# Patient Record
Sex: Female | Born: 1955 | Race: White | Hispanic: No | Marital: Married | Smoking: Never smoker
Health system: Southern US, Community
[De-identification: ages and names within clinical notes are randomized; demographics above are authoritative.]

## PROBLEM LIST (undated history)

## (undated) DIAGNOSIS — F419 Anxiety disorder, unspecified: Secondary | ICD-10-CM

## (undated) DIAGNOSIS — Z8489 Family history of other specified conditions: Secondary | ICD-10-CM

## (undated) DIAGNOSIS — M1990 Unspecified osteoarthritis, unspecified site: Secondary | ICD-10-CM

## (undated) DIAGNOSIS — D649 Anemia, unspecified: Secondary | ICD-10-CM

## (undated) HISTORY — PX: BREAST EXCISIONAL BIOPSY: SUR124

## (undated) HISTORY — PX: JOINT REPLACEMENT: SHX530

## (undated) HISTORY — PX: EYE SURGERY: SHX253

## (undated) HISTORY — PX: BACK SURGERY: SHX140

---

## 1999-08-09 ENCOUNTER — Ambulatory Visit (HOSPITAL_BASED_OUTPATIENT_CLINIC_OR_DEPARTMENT_OTHER): Admission: RE | Admit: 1999-08-09 | Discharge: 1999-08-09 | Payer: Self-pay | Admitting: *Deleted

## 2003-06-22 ENCOUNTER — Encounter: Admission: RE | Admit: 2003-06-22 | Discharge: 2003-06-22 | Payer: Self-pay | Admitting: Family Medicine

## 2003-06-22 ENCOUNTER — Encounter: Payer: Self-pay | Admitting: Family Medicine

## 2004-09-02 ENCOUNTER — Encounter: Admission: RE | Admit: 2004-09-02 | Discharge: 2004-09-02 | Payer: Self-pay | Admitting: Family Medicine

## 2005-09-03 ENCOUNTER — Encounter: Admission: RE | Admit: 2005-09-03 | Discharge: 2005-09-03 | Payer: Self-pay | Admitting: Family Medicine

## 2006-09-07 ENCOUNTER — Encounter: Admission: RE | Admit: 2006-09-07 | Discharge: 2006-09-07 | Payer: Self-pay | Admitting: Family Medicine

## 2007-09-09 ENCOUNTER — Encounter: Admission: RE | Admit: 2007-09-09 | Discharge: 2007-09-09 | Payer: Self-pay | Admitting: Family Medicine

## 2008-09-11 ENCOUNTER — Encounter: Admission: RE | Admit: 2008-09-11 | Discharge: 2008-09-11 | Payer: Self-pay | Admitting: Family Medicine

## 2009-05-23 ENCOUNTER — Encounter: Admission: RE | Admit: 2009-05-23 | Discharge: 2009-05-23 | Payer: Self-pay | Admitting: Family Medicine

## 2009-06-25 ENCOUNTER — Emergency Department (HOSPITAL_COMMUNITY): Admission: EM | Admit: 2009-06-25 | Discharge: 2009-06-25 | Payer: Self-pay | Admitting: Emergency Medicine

## 2009-09-12 ENCOUNTER — Encounter: Admission: RE | Admit: 2009-09-12 | Discharge: 2009-09-12 | Payer: Self-pay | Admitting: Family Medicine

## 2009-10-03 ENCOUNTER — Encounter: Admission: RE | Admit: 2009-10-03 | Discharge: 2009-10-03 | Payer: Self-pay | Admitting: Family Medicine

## 2010-09-16 ENCOUNTER — Encounter: Admission: RE | Admit: 2010-09-16 | Discharge: 2010-09-16 | Payer: Self-pay | Admitting: Family Medicine

## 2010-12-31 DIAGNOSIS — N951 Menopausal and female climacteric states: Secondary | ICD-10-CM | POA: Insufficient documentation

## 2011-09-10 ENCOUNTER — Other Ambulatory Visit: Payer: Self-pay | Admitting: Family Medicine

## 2011-09-10 DIAGNOSIS — Z1231 Encounter for screening mammogram for malignant neoplasm of breast: Secondary | ICD-10-CM

## 2011-09-19 ENCOUNTER — Ambulatory Visit
Admission: RE | Admit: 2011-09-19 | Discharge: 2011-09-19 | Disposition: A | Discharge status: Home / Self Care | Payer: BC Managed Care – PPO | Source: Ambulatory Visit | Attending: Family Medicine | Admitting: Family Medicine

## 2011-09-19 DIAGNOSIS — Z1231 Encounter for screening mammogram for malignant neoplasm of breast: Secondary | ICD-10-CM

## 2012-08-30 ENCOUNTER — Other Ambulatory Visit: Payer: Self-pay | Admitting: Family Medicine

## 2012-08-30 DIAGNOSIS — Z1231 Encounter for screening mammogram for malignant neoplasm of breast: Secondary | ICD-10-CM

## 2012-10-07 ENCOUNTER — Ambulatory Visit
Admission: RE | Admit: 2012-10-07 | Discharge: 2012-10-07 | Disposition: A | Discharge status: Home / Self Care | Payer: BC Managed Care – PPO | Source: Ambulatory Visit | Attending: Family Medicine | Admitting: Family Medicine

## 2012-10-07 DIAGNOSIS — Z1231 Encounter for screening mammogram for malignant neoplasm of breast: Secondary | ICD-10-CM

## 2012-10-18 ENCOUNTER — Other Ambulatory Visit: Payer: Self-pay | Admitting: Family Medicine

## 2012-10-18 DIAGNOSIS — R928 Other abnormal and inconclusive findings on diagnostic imaging of breast: Secondary | ICD-10-CM

## 2012-10-22 ENCOUNTER — Ambulatory Visit
Admission: RE | Admit: 2012-10-22 | Discharge: 2012-10-22 | Disposition: A | Discharge status: Home / Self Care | Payer: BC Managed Care – PPO | Source: Ambulatory Visit | Attending: Family Medicine | Admitting: Family Medicine

## 2012-10-22 DIAGNOSIS — R928 Other abnormal and inconclusive findings on diagnostic imaging of breast: Secondary | ICD-10-CM

## 2013-01-07 DIAGNOSIS — M1710 Unilateral primary osteoarthritis, unspecified knee: Secondary | ICD-10-CM | POA: Insufficient documentation

## 2013-07-11 DIAGNOSIS — H35379 Puckering of macula, unspecified eye: Secondary | ICD-10-CM | POA: Insufficient documentation

## 2013-11-11 ENCOUNTER — Other Ambulatory Visit: Payer: Self-pay

## 2013-11-11 DIAGNOSIS — Z1231 Encounter for screening mammogram for malignant neoplasm of breast: Secondary | ICD-10-CM

## 2013-11-29 ENCOUNTER — Ambulatory Visit
Admission: RE | Admit: 2013-11-29 | Discharge: 2013-11-29 | Disposition: A | Discharge status: Home / Self Care | Payer: BC Managed Care – PPO | Source: Ambulatory Visit

## 2013-11-29 DIAGNOSIS — Z1231 Encounter for screening mammogram for malignant neoplasm of breast: Secondary | ICD-10-CM

## 2014-03-20 ENCOUNTER — Other Ambulatory Visit: Payer: Self-pay | Admitting: Orthopedic Surgery

## 2014-03-30 ENCOUNTER — Encounter (HOSPITAL_COMMUNITY): Payer: Self-pay | Admitting: Pharmacy Technician

## 2014-03-31 ENCOUNTER — Ambulatory Visit (HOSPITAL_COMMUNITY)
Admission: RE | Admit: 2014-03-31 | Discharge: 2014-03-31 | Disposition: A | Discharge status: Home / Self Care | Payer: BC Managed Care – PPO | Source: Ambulatory Visit | Attending: Orthopedic Surgery | Admitting: Orthopedic Surgery

## 2014-03-31 ENCOUNTER — Encounter (HOSPITAL_COMMUNITY)
Admission: RE | Admit: 2014-03-31 | Discharge: 2014-03-31 | Disposition: A | Discharge status: Home / Self Care | Payer: BC Managed Care – PPO | Source: Ambulatory Visit | Attending: Orthopedic Surgery | Admitting: Orthopedic Surgery

## 2014-03-31 ENCOUNTER — Other Ambulatory Visit: Payer: Self-pay

## 2014-03-31 ENCOUNTER — Encounter (HOSPITAL_COMMUNITY): Payer: Self-pay

## 2014-03-31 DIAGNOSIS — Z01818 Encounter for other preprocedural examination: Secondary | ICD-10-CM | POA: Insufficient documentation

## 2014-03-31 DIAGNOSIS — Z01812 Encounter for preprocedural laboratory examination: Secondary | ICD-10-CM | POA: Insufficient documentation

## 2014-03-31 HISTORY — DX: Anemia, unspecified: D64.9

## 2014-03-31 HISTORY — DX: Unspecified osteoarthritis, unspecified site: M19.90

## 2014-03-31 HISTORY — DX: Anxiety disorder, unspecified: F41.9

## 2014-03-31 LAB — BASIC METABOLIC PANEL
BUN: 19 mg/dL (ref 6–23)
CO2: 27 mEq/L (ref 19–32)
Calcium: 9.9 mg/dL (ref 8.4–10.5)
Chloride: 105 mEq/L (ref 96–112)
Creatinine, Ser: 0.67 mg/dL (ref 0.50–1.10)
GFR calc non Af Amer: >90 mL/min (ref >90)
Glucose, Bld: 68 mg/dL — ABNORMAL LOW (ref 70–99)
POTASSIUM: 4.8 meq/L (ref 3.7–5.3)
Sodium: 143 mEq/L (ref 137–147)

## 2014-03-31 LAB — CBC WITH DIFFERENTIAL/PLATELET
BASOS ABS: 0 10*3/uL (ref 0.0–0.1)
BASOS PCT: 0 % (ref 0–1)
Eosinophils Absolute: 0.1 10*3/uL (ref 0.0–0.7)
Eosinophils Relative: 1 % (ref 0–5)
HCT: 39.8 % (ref 36.0–46.0)
HEMOGLOBIN: 13.2 g/dL (ref 12.0–15.0)
Lymphocytes Relative: 38 % (ref 12–46)
Lymphs Abs: 3.5 10*3/uL (ref 0.7–4.0)
MCH: 29.2 pg (ref 26.0–34.0)
MCHC: 33.2 g/dL (ref 30.0–36.0)
MCV: 88.1 fL (ref 78.0–100.0)
MONOS PCT: 7 % (ref 3–12)
Monocytes Absolute: 0.6 10*3/uL (ref 0.1–1.0)
NEUTROS ABS: 4.9 10*3/uL (ref 1.7–7.7)
Neutrophils Relative %: 54 % (ref 43–77)
PLATELETS: 251 10*3/uL (ref 150–400)
RBC: 4.52 MIL/uL (ref 3.87–5.11)
RDW: 14.2 % (ref 11.5–15.5)
WBC: 9.1 10*3/uL (ref 4.0–10.5)

## 2014-03-31 LAB — URINALYSIS, ROUTINE W REFLEX MICROSCOPIC
Bilirubin Urine: NEGATIVE
GLUCOSE, UA: NEGATIVE mg/dL
HGB URINE DIPSTICK: NEGATIVE
KETONES UR: NEGATIVE mg/dL
Leukocytes, UA: NEGATIVE
Nitrite: NEGATIVE
PH: 5 (ref 5.0–8.0)
PROTEIN: NEGATIVE mg/dL
Specific Gravity, Urine: 1.02 (ref 1.005–1.030)
Urobilinogen, UA: 0.2 mg/dL (ref 0.0–1.0)

## 2014-03-31 LAB — SURGICAL PCR SCREEN
MRSA, PCR: NEGATIVE
STAPHYLOCOCCUS AUREUS: NEGATIVE

## 2014-03-31 LAB — ABO/RH: ABO/RH(D): A POS

## 2014-03-31 LAB — PROTIME-INR
INR: 0.93 (ref 0.00–1.49)
PROTHROMBIN TIME: 12.3 s (ref 11.6–15.2)

## 2014-03-31 LAB — TYPE AND SCREEN
ABO/RH(D): A POS
Antibody Screen: NEGATIVE

## 2014-03-31 LAB — APTT: aPTT: 31 seconds (ref 24–37)

## 2014-03-31 NOTE — Pre-Procedure Instructions (Signed)
[name]  03/31/2014   Your procedure is scheduled on: 04-10-2014   Monday   [facility] at 5:30 AM.   Call this number if you have problems the morning of surgery: [phone]   Remember:   Do not eat food or drink liquids after midnight.    Take these medicines the morning of surgery with A SIP OF WATER: none    Do not wear jewelry, make-up or nail polish.  Do not wear lotions, powders, or perfumes.   Do not shave 48 hours prior to surgery. Men may shave face and neck.  Do not bring valuables to the hospital.  [facility] is not responsible for any belongings or valuables.               Contacts, dentures or bridgework may not be worn into surgery.   Leave suitcase in the car. After surgery it may be brought to your room.   For patients admitted to the hospital, discharge time is determined by your treatment team.               Patients discharged the day of surgery will not be allowed to drive home.    Special Instructions: See attached sheet for instructions on CHG shower/bath   Please read over the following fact sheets that you were given: Pain Booklet, Coughing and Deep Breathing, Blood Transfusion Information and Surgical Site Infection Prevention

## 2014-04-08 DIAGNOSIS — M1711 Unilateral primary osteoarthritis, right knee: Secondary | ICD-10-CM | POA: Diagnosis present

## 2014-04-08 NOTE — H&P (Signed)
TOTAL KNEE ADMISSION H&P  Patient is being admitted for right total knee arthroplasty.  Subjective:  Chief Complaint:right knee pain.  HPI: Anne Parks, 58 y.o. female, has a history of pain and functional disability in the right knee due to arthritis and has failed non-surgical conservative treatments for greater than 12 weeks to includeNSAID's and/or analgesics, corticosteriod injections, use of assistive devices, weight reduction as appropriate and activity modification.  Onset of symptoms was gradual, starting >10 years ago with gradually worsening course since that time. The patient noted no past surgery on the right knee(s).  Patient currently rates pain in the right knee(s) at 10 out of 10 with activity. Patient has worsening of pain with activity and weight bearing, pain that interferes with activities of daily living and pain with passive range of motion.  Patient has evidence of joint subluxation and joint space narrowing by imaging studies.  There is no active infection.  Patient Active Problem List   Diagnosis Date Noted  . Arthritis of knee, right 04/08/2014   Past Medical History  Diagnosis Date  . Anxiety   . Arthritis   . Anemia     Past Surgical History  Procedure Laterality Date  . Back surgery    . Eye surgery      cataract  . Cesarean section  1990    No prescriptions prior to admission   No Known Allergies  History  Substance Use Topics  . Smoking status: Never Smoker   . Smokeless tobacco: Not on file  . Alcohol Use: No    No family history on file.   Review of Systems  Constitutional: Negative.   HENT: Negative.   Eyes: Negative.   Respiratory: Negative.   Cardiovascular: Negative.   Gastrointestinal: Negative.   Genitourinary: Negative.   Musculoskeletal: Positive for joint pain.  Skin: Negative.   Neurological: Negative.   Endo/Heme/Allergies: Negative.   Psychiatric/Behavioral: Negative.     Objective:  Physical Exam   Constitutional: She is oriented to person, place, and time. She appears well-developed and well-nourished.  HENT:  Head: Normocephalic and atraumatic.  Eyes: Pupils are equal, round, and reactive to light.  Neck: Normal range of motion. Neck supple.  Cardiovascular: Intact distal pulses.   Respiratory: Effort normal.  Musculoskeletal: She exhibits tenderness.  She has bilateral varus deformities of the knees 5 flexion contractures, but can flex 125 bilaterally she is tender along the medial joint line.  There is crepitus as you take her through range of motion on the medial side, right greater than left.  She is neurovascularly intact.    Neurological: She is alert and oriented to person, place, and time.  Skin: Skin is warm and dry.  Psychiatric: She has a normal mood and affect. Her behavior is normal. Judgment and thought content normal.    Vital signs in last 24 hours:    Labs:   There is no height or weight on file to calculate BMI.   Imaging Review X-rays showed medial compartment bone-on-bone arthritis early lateral subluxation of tibia beneath the femur on the right, minimal on the left.    Assessment/Plan:  End stage arthritis, right knee   The patient history, physical examination, clinical judgment of the provider and imaging studies are consistent with end stage degenerative joint disease of the right knee(s) and total knee arthroplasty is deemed medically necessary. The treatment options including medical management, injection therapy arthroscopy and arthroplasty were discussed at length. The risks and benefits of total knee  arthroplasty were presented and reviewed. The risks due to aseptic loosening, infection, stiffness, patella tracking problems, thromboembolic complications and other imponderables were discussed. The patient acknowledged the explanation, agreed to proceed with the plan and consent was signed. Patient is being admitted for inpatient treatment for  surgery, pain control, PT, OT, prophylactic antibiotics, VTE prophylaxis, progressive ambulation and ADL's and discharge planning. The patient is planning to be discharged home with home health services

## 2014-04-09 MED ORDER — TRANEXAMIC ACID 100 MG/ML IV SOLN
1000.0000 mg | INTRAVENOUS | Status: AC
  Administered 2014-04-10: 1000 mg via INTRAVENOUS
  Filled 2014-04-09: qty 10

## 2014-04-09 MED ORDER — CEFAZOLIN SODIUM-DEXTROSE 2-3 GM-% IV SOLR
2.0000 g | INTRAVENOUS | Status: AC
  Administered 2014-04-10: 2 g via INTRAVENOUS
  Filled 2014-04-09: qty 50

## 2014-04-10 ENCOUNTER — Encounter (HOSPITAL_COMMUNITY): Payer: Self-pay | Admitting: *Deleted

## 2014-04-10 ENCOUNTER — Encounter (HOSPITAL_COMMUNITY)
Admission: RE | Disposition: A | Discharge status: Home Health | Payer: Self-pay | Source: Ambulatory Visit | Attending: Orthopedic Surgery

## 2014-04-10 ENCOUNTER — Encounter (HOSPITAL_COMMUNITY): Payer: BC Managed Care – PPO | Admitting: Vascular Surgery

## 2014-04-10 ENCOUNTER — Inpatient Hospital Stay (HOSPITAL_COMMUNITY)
Admission: RE | Admit: 2014-04-10 | Discharge: 2014-04-12 | DRG: 470 | Disposition: A | Discharge status: Home Health | Payer: BC Managed Care – PPO | Source: Ambulatory Visit | Attending: Orthopedic Surgery | Admitting: Orthopedic Surgery

## 2014-04-10 ENCOUNTER — Inpatient Hospital Stay (HOSPITAL_COMMUNITY): Payer: BC Managed Care – PPO | Admitting: Certified Registered Nurse Anesthetist

## 2014-04-10 DIAGNOSIS — M1710 Unilateral primary osteoarthritis, unspecified knee: Principal | ICD-10-CM | POA: Diagnosis present

## 2014-04-10 DIAGNOSIS — F411 Generalized anxiety disorder: Secondary | ICD-10-CM | POA: Diagnosis present

## 2014-04-10 DIAGNOSIS — M1711 Unilateral primary osteoarthritis, right knee: Secondary | ICD-10-CM | POA: Diagnosis present

## 2014-04-10 HISTORY — PX: TOTAL KNEE ARTHROPLASTY: SHX125

## 2014-04-10 HISTORY — DX: Family history of other specified conditions: Z84.89

## 2014-04-10 SURGERY — ARTHROPLASTY, KNEE, TOTAL
Anesthesia: General | Site: Knee | Laterality: Right

## 2014-04-10 MED ORDER — ONDANSETRON HCL 4 MG/2ML IJ SOLN: 4.0000 mg | Freq: Once | INTRAMUSCULAR | Status: DC | PRN

## 2014-04-10 MED ORDER — KCL IN DEXTROSE-NACL 20-5-0.45 MEQ/L-%-% IV SOLN
INTRAVENOUS | Status: DC
  Administered 2014-04-10 – 2014-04-11 (×3): via INTRAVENOUS
  Filled 2014-04-10 (×8): qty 1000

## 2014-04-10 MED ORDER — MIDAZOLAM HCL 5 MG/5ML IJ SOLN
INTRAMUSCULAR | Status: DC | PRN
  Administered 2014-04-10: 1 mg via INTRAVENOUS

## 2014-04-10 MED ORDER — SODIUM CHLORIDE 0.9 % IJ SOLN
INTRAMUSCULAR | Status: DC | PRN
  Administered 2014-04-10: 40 mL via INTRAVENOUS

## 2014-04-10 MED ORDER — ONDANSETRON HCL 4 MG/2ML IJ SOLN
4.0000 mg | Freq: Four times a day (QID) | INTRAMUSCULAR | Status: DC | PRN
  Administered 2014-04-10: 4 mg via INTRAVENOUS
  Filled 2014-04-10: qty 2

## 2014-04-10 MED ORDER — ACETAMINOPHEN 325 MG PO TABS: 650.0000 mg | ORAL_TABLET | Freq: Four times a day (QID) | ORAL | Status: DC | PRN

## 2014-04-10 MED ORDER — BUPIVACAINE LIPOSOME 1.3 % IJ SUSP
INTRAMUSCULAR | Status: DC | PRN
  Administered 2014-04-10: 20 mL

## 2014-04-10 MED ORDER — ALUM & MAG HYDROXIDE-SIMETH 200-200-20 MG/5ML PO SUSP: 30.0000 mL | ORAL | Status: DC | PRN

## 2014-04-10 MED ORDER — PROPOFOL 10 MG/ML IV BOLUS
INTRAVENOUS | Status: DC | PRN
  Administered 2014-04-10: 170 mg via INTRAVENOUS

## 2014-04-10 MED ORDER — METOCLOPRAMIDE HCL 5 MG/ML IJ SOLN: 5.0000 mg | Freq: Three times a day (TID) | INTRAMUSCULAR | Status: DC | PRN

## 2014-04-10 MED ORDER — OXYCODONE-ACETAMINOPHEN 5-325 MG PO TABS: 1.0000 | ORAL_TABLET | ORAL | Status: DC | PRN

## 2014-04-10 MED ORDER — ONDANSETRON HCL 4 MG PO TABS: 4.0000 mg | ORAL_TABLET | Freq: Four times a day (QID) | ORAL | Status: DC | PRN

## 2014-04-10 MED ORDER — FENTANYL CITRATE 0.05 MG/ML IJ SOLN
INTRAMUSCULAR | Status: DC | PRN
  Administered 2014-04-10 (×3): 25 ug via INTRAVENOUS
  Administered 2014-04-10: 50 ug via INTRAVENOUS
  Administered 2014-04-10: 25 ug via INTRAVENOUS
  Administered 2014-04-10: 50 ug via INTRAVENOUS
  Administered 2014-04-10 (×2): 25 ug via INTRAVENOUS

## 2014-04-10 MED ORDER — HYDROMORPHONE HCL PF 1 MG/ML IJ SOLN
INTRAMUSCULAR | Status: AC
  Filled 2014-04-10: qty 1

## 2014-04-10 MED ORDER — METHOCARBAMOL 500 MG PO TABS
500.0000 mg | ORAL_TABLET | Freq: Four times a day (QID) | ORAL | Status: DC | PRN
  Administered 2014-04-11 – 2014-04-12 (×2): 500 mg via ORAL
  Filled 2014-04-10 (×2): qty 1

## 2014-04-10 MED ORDER — DIPHENHYDRAMINE HCL 12.5 MG/5ML PO ELIX: 12.5000 mg | ORAL_SOLUTION | ORAL | Status: DC | PRN

## 2014-04-10 MED ORDER — LIDOCAINE HCL (CARDIAC) 20 MG/ML IV SOLN
INTRAVENOUS | Status: AC
  Filled 2014-04-10: qty 5

## 2014-04-10 MED ORDER — PROPOFOL 10 MG/ML IV BOLUS
INTRAVENOUS | Status: AC
  Filled 2014-04-10: qty 20

## 2014-04-10 MED ORDER — PHENOL 1.4 % MT LIQD: 1.0000 | OROMUCOSAL | Status: DC | PRN

## 2014-04-10 MED ORDER — DOCUSATE SODIUM 100 MG PO CAPS
100.0000 mg | ORAL_CAPSULE | Freq: Two times a day (BID) | ORAL | Status: DC
  Administered 2014-04-10 – 2014-04-12 (×4): 100 mg via ORAL
  Filled 2014-04-10 (×5): qty 1

## 2014-04-10 MED ORDER — SENNOSIDES-DOCUSATE SODIUM 8.6-50 MG PO TABS: 1.0000 | ORAL_TABLET | Freq: Every evening | ORAL | Status: DC | PRN

## 2014-04-10 MED ORDER — KCL IN DEXTROSE-NACL 20-5-0.45 MEQ/L-%-% IV SOLN
INTRAVENOUS | Status: AC
  Filled 2014-04-10: qty 1000

## 2014-04-10 MED ORDER — LIDOCAINE HCL (CARDIAC) 20 MG/ML IV SOLN
INTRAVENOUS | Status: DC | PRN
  Administered 2014-04-10: 100 mg via INTRAVENOUS

## 2014-04-10 MED ORDER — OXYCODONE HCL 5 MG PO TABS
5.0000 mg | ORAL_TABLET | ORAL | Status: DC | PRN
  Administered 2014-04-10 – 2014-04-12 (×11): 10 mg via ORAL
  Filled 2014-04-10 (×12): qty 2

## 2014-04-10 MED ORDER — BUPIVACAINE LIPOSOME 1.3 % IJ SUSP
20.0000 mL | Freq: Once | INTRAMUSCULAR | Status: DC
  Filled 2014-04-10: qty 20

## 2014-04-10 MED ORDER — BUPIVACAINE-EPINEPHRINE (PF) 0.25% -1:200000 IJ SOLN
INTRAMUSCULAR | Status: AC
  Filled 2014-04-10: qty 30

## 2014-04-10 MED ORDER — METOCLOPRAMIDE HCL 10 MG PO TABS: 5.0000 mg | ORAL_TABLET | Freq: Three times a day (TID) | ORAL | Status: DC | PRN

## 2014-04-10 MED ORDER — ASPIRIN EC 325 MG PO TBEC
325.0000 mg | DELAYED_RELEASE_TABLET | Freq: Every day | ORAL | Status: DC
  Administered 2014-04-11 – 2014-04-12 (×2): 325 mg via ORAL
  Filled 2014-04-10 (×3): qty 1

## 2014-04-10 MED ORDER — MIDAZOLAM HCL 2 MG/2ML IJ SOLN
INTRAMUSCULAR | Status: AC
  Filled 2014-04-10: qty 2

## 2014-04-10 MED ORDER — CHLORHEXIDINE GLUCONATE 4 % EX LIQD: 60.0000 mL | Freq: Once | CUTANEOUS | Status: DC

## 2014-04-10 MED ORDER — FENTANYL CITRATE 0.05 MG/ML IJ SOLN
INTRAMUSCULAR | Status: AC
  Filled 2014-04-10: qty 5

## 2014-04-10 MED ORDER — BISACODYL 5 MG PO TBEC: 5.0000 mg | DELAYED_RELEASE_TABLET | Freq: Every day | ORAL | Status: DC | PRN

## 2014-04-10 MED ORDER — CEFUROXIME SODIUM 1.5 G IJ SOLR
INTRAMUSCULAR | Status: DC | PRN
  Administered 2014-04-10: 1.5 g

## 2014-04-10 MED ORDER — LORATADINE 10 MG PO TABS
10.0000 mg | ORAL_TABLET | Freq: Every day | ORAL | Status: DC
  Administered 2014-04-11 (×2): 10 mg via ORAL
  Filled 2014-04-10 (×4): qty 1

## 2014-04-10 MED ORDER — ASPIRIN EC 325 MG PO TBEC: 325.0000 mg | DELAYED_RELEASE_TABLET | Freq: Two times a day (BID) | ORAL | Status: DC

## 2014-04-10 MED ORDER — HYDROMORPHONE HCL PF 1 MG/ML IJ SOLN
1.0000 mg | INTRAMUSCULAR | Status: DC | PRN
  Administered 2014-04-10: 1 mg via INTRAVENOUS
  Filled 2014-04-10 (×2): qty 1

## 2014-04-10 MED ORDER — MENTHOL 3 MG MT LOZG
1.0000 | LOZENGE | OROMUCOSAL | Status: DC | PRN
Start: 2014-04-10 — End: 2014-04-12

## 2014-04-10 MED ORDER — METHOCARBAMOL 500 MG PO TABS: 500.0000 mg | ORAL_TABLET | Freq: Two times a day (BID) | ORAL | Status: DC

## 2014-04-10 MED ORDER — ONDANSETRON HCL 4 MG/2ML IJ SOLN
INTRAMUSCULAR | Status: DC | PRN
  Administered 2014-04-10: 4 mg via INTRAVENOUS

## 2014-04-10 MED ORDER — SODIUM CHLORIDE 0.9 % IR SOLN
Status: DC | PRN
  Administered 2014-04-10: 1000 mL

## 2014-04-10 MED ORDER — ONDANSETRON HCL 4 MG/2ML IJ SOLN
INTRAMUSCULAR | Status: AC
  Filled 2014-04-10: qty 2

## 2014-04-10 MED ORDER — ACETAMINOPHEN 650 MG RE SUPP: 650.0000 mg | Freq: Four times a day (QID) | RECTAL | Status: DC | PRN

## 2014-04-10 MED ORDER — CEFUROXIME SODIUM 1.5 G IJ SOLR
INTRAMUSCULAR | Status: AC
  Filled 2014-04-10: qty 1.5

## 2014-04-10 MED ORDER — HYDROMORPHONE HCL PF 1 MG/ML IJ SOLN
0.2500 mg | INTRAMUSCULAR | Status: DC | PRN
  Administered 2014-04-10: 0.5 mg via INTRAVENOUS

## 2014-04-10 MED ORDER — DEXTROSE-NACL 5-0.45 % IV SOLN: INTRAVENOUS | Status: DC

## 2014-04-10 MED ORDER — LACTATED RINGERS IV SOLN
INTRAVENOUS | Status: DC | PRN
  Administered 2014-04-10: 07:00:00 via INTRAVENOUS

## 2014-04-10 MED ORDER — MAGNESIUM CITRATE PO SOLN: 1.0000 | Freq: Once | ORAL | Status: AC | PRN

## 2014-04-10 MED ORDER — METHOCARBAMOL 1000 MG/10ML IJ SOLN
500.0000 mg | Freq: Four times a day (QID) | INTRAVENOUS | Status: DC | PRN
  Filled 2014-04-10: qty 5

## 2014-04-10 SURGICAL SUPPLY — 64 items
BANDAGE ESMARK 6X9 LF (GAUZE/BANDAGES/DRESSINGS) ×1 IMPLANT
BLADE SAG 18X100X1.27 (BLADE) ×3 IMPLANT
BLADE SAW SGTL 13X75X1.27 (BLADE) ×3 IMPLANT
BLADE SURG ROTATE 9660 (MISCELLANEOUS) IMPLANT
BNDG CMPR 9X6 STRL LF SNTH (GAUZE/BANDAGES/DRESSINGS) ×1
BNDG CMPR MED 10X6 ELC LF (GAUZE/BANDAGES/DRESSINGS) ×1
BNDG ELASTIC 6X10 VLCR STRL LF (GAUZE/BANDAGES/DRESSINGS) ×3 IMPLANT
BNDG ESMARK 6X9 LF (GAUZE/BANDAGES/DRESSINGS) ×3
BOWL SMART MIX CTS (DISPOSABLE) ×3 IMPLANT
CAP KNEE ATTUNE RP ×2 IMPLANT
CEMENT HV SMART SET (Cement) ×6 IMPLANT
COVER SURGICAL LIGHT HANDLE (MISCELLANEOUS) ×3 IMPLANT
CUFF TOURNIQUET SINGLE 34IN LL (TOURNIQUET CUFF) ×2 IMPLANT
CUFF TOURNIQUET SINGLE 44IN (TOURNIQUET CUFF) IMPLANT
DRAPE EXTREMITY T 121X128X90 (DRAPE) ×3 IMPLANT
DRAPE U-SHAPE 47X51 STRL (DRAPES) ×3 IMPLANT
DURAPREP 26ML APPLICATOR (WOUND CARE) ×4 IMPLANT
ELECT REM PT RETURN 9FT ADLT (ELECTROSURGICAL) ×3
ELECTRODE REM PT RTRN 9FT ADLT (ELECTROSURGICAL) ×1 IMPLANT
EVACUATOR 1/8 PVC DRAIN (DRAIN) ×3 IMPLANT
GAUZE XEROFORM 1X8 LF (GAUZE/BANDAGES/DRESSINGS) ×3 IMPLANT
GLOVE BIO SURGEON STRL SZ7.5 (GLOVE) ×3 IMPLANT
GLOVE BIO SURGEON STRL SZ8.5 (GLOVE) ×3 IMPLANT
GLOVE BIOGEL PI IND STRL 6.5 (GLOVE) IMPLANT
GLOVE BIOGEL PI IND STRL 8 (GLOVE) ×1 IMPLANT
GLOVE BIOGEL PI IND STRL 8.5 (GLOVE) IMPLANT
GLOVE BIOGEL PI IND STRL 9 (GLOVE) ×1 IMPLANT
GLOVE BIOGEL PI INDICATOR 6.5 (GLOVE) ×2
GLOVE BIOGEL PI INDICATOR 8 (GLOVE) ×2
GLOVE BIOGEL PI INDICATOR 8.5 (GLOVE) ×2
GLOVE BIOGEL PI INDICATOR 9 (GLOVE) ×2
GOWN STRL REUS W/ TWL LRG LVL3 (GOWN DISPOSABLE) ×1 IMPLANT
GOWN STRL REUS W/ TWL XL LVL3 (GOWN DISPOSABLE) ×2 IMPLANT
GOWN STRL REUS W/TWL LRG LVL3 (GOWN DISPOSABLE) ×3
GOWN STRL REUS W/TWL XL LVL3 (GOWN DISPOSABLE) ×6
HANDPIECE INTERPULSE COAX TIP (DISPOSABLE) ×3
HOOD PEEL AWAY FACE SHEILD DIS (HOOD) ×6 IMPLANT
KIT BASIN OR (CUSTOM PROCEDURE TRAY) ×3 IMPLANT
KIT ROOM TURNOVER OR (KITS) ×3 IMPLANT
MANIFOLD NEPTUNE II (INSTRUMENTS) ×3 IMPLANT
NDL SAFETY ECLIPSE 18X1.5 (NEEDLE) IMPLANT
NDL SPNL 18GX3.5 QUINCKE PK (NEEDLE) IMPLANT
NEEDLE 22X1 1/2 (OR ONLY) (NEEDLE) ×1 IMPLANT
NEEDLE HYPO 18GX1.5 SHARP (NEEDLE)
NEEDLE SPNL 18GX3.5 QUINCKE PK (NEEDLE) ×3 IMPLANT
NS IRRIG 1000ML POUR BTL (IV SOLUTION) ×3 IMPLANT
PACK TOTAL JOINT (CUSTOM PROCEDURE TRAY) ×3 IMPLANT
PAD ARMBOARD 7.5X6 YLW CONV (MISCELLANEOUS) ×4 IMPLANT
PADDING CAST COTTON 6X4 STRL (CAST SUPPLIES) ×1 IMPLANT
SET HNDPC FAN SPRY TIP SCT (DISPOSABLE) ×1 IMPLANT
SPONGE GAUZE 4X4 12PLY (GAUZE/BANDAGES/DRESSINGS) ×6 IMPLANT
STAPLER VISISTAT 35W (STAPLE) ×3 IMPLANT
SUCTION FRAZIER TIP 10 FR DISP (SUCTIONS) ×1 IMPLANT
SUT VIC AB 0 CTX 36 (SUTURE) ×3
SUT VIC AB 0 CTX36XBRD ANTBCTR (SUTURE) ×1 IMPLANT
SUT VIC AB 1 CTX 36 (SUTURE) ×3
SUT VIC AB 1 CTX36XBRD ANBCTR (SUTURE) ×1 IMPLANT
SUT VIC AB 2-0 CT1 27 (SUTURE) ×3
SUT VIC AB 2-0 CT1 TAPERPNT 27 (SUTURE) ×1 IMPLANT
SYR 30ML LL (SYRINGE) ×1 IMPLANT
SYR 50ML LL SCALE MARK (SYRINGE) ×3 IMPLANT
TOWEL OR 17X24 6PK STRL BLUE (TOWEL DISPOSABLE) ×3 IMPLANT
TOWEL OR 17X26 10 PK STRL BLUE (TOWEL DISPOSABLE) ×3 IMPLANT
WATER STERILE IRR 1000ML POUR (IV SOLUTION) ×4 IMPLANT

## 2014-04-10 NOTE — Transfer of Care (Signed)
Immediate Anesthesia Transfer of Care Note  Patient: Anne Parks  Procedure(s) Performed: Procedure(s): RIGHT TOTAL KNEE ARTHOPLASTY (Right)  Patient Location: PACU  Anesthesia Type:General  Level of Consciousness: awake, alert  and oriented  Airway & Oxygen Therapy: Patient Spontanous Breathing  Post-op Assessment: Report given to PACU RN  Post vital signs: Reviewed and stable  Complications: No apparent anesthesia complications

## 2014-04-10 NOTE — Op Note (Signed)
PATIENT ID:      Anne Parks  MRN:     [id number] DOB/AGE:    1955/12/02 / 58 y.o.       OPERATIVE REPORT    DATE OF PROCEDURE:  04/10/2014       PREOPERATIVE DIAGNOSIS:   RIGHT KNEE OSTEOARTHRITIS      Estimated body mass index is 28.35 kg/(m^2) as calculated from the following:   Height as of 03/31/14: 5\' 3"  (1.6 m).   Weight as of this encounter: 72.576 kg (160 lb).                                                        POSTOPERATIVE DIAGNOSIS:   RIGHT KNEE OSTEOARTHRITIS                                                                      PROCEDURE:  Procedure(s): RIGHT TOTAL KNEE ARTHOPLASTY Using DepuyAttune RP implants #5R Femur, #4Tibia, 10mm Attune RP bearing, 38 Patella     SURGEON: ROWAN,FRANK J    ASSISTANT:   Eric K. [employer] PA-C   (Present and scrubbed throughout the case, critical for assistance with exposure, retraction, instrumentation, and closure.)         ANESTHESIA: GET, ACB, Exparel  DRAINS: 2 medium hemovac in knee   TOURNIQUET TIME: 75min   COMPLICATIONS:  None     SPECIMENS: None   INDICATIONS FOR PROCEDURE: The patient has  RIGHT KNEE OSTEOARTHRITIS, varus deformities, XR shows bone on bone arthritis. Patient has failed all conservative measures including anti-inflammatory medicines, narcotics, attempts at  exercise and weight loss, cortisone injections and viscosupplementation.  Risks and benefits of surgery have been discussed, questions answered.   DESCRIPTION OF PROCEDURE: The patient identified by armband, received  IV antibiotics, [facility]. Patient taken to the operating room, appropriate anesthetic  monitors were attached, and general endotracheal anesthesia induced with  the patient in supine position, Foley catheter was inserted. Tourniquet  applied high to the operative thigh. Lateral post and foot positioner  applied to the table, the lower extremity was then prepped and draped  in usual sterile fashion from  the ankle to the tourniquet. Time-out procedure was performed. The limb was wrapped with an Esmarch bandage and the tourniquet inflated to 350 mmHg. We began the operation by making the anterior midline incision starting at handbreadth above the patella going over the patella 1 cm medial to and  4 cm distal to the tibial tubercle. Small bleeders in the skin and the  subcutaneous tissue identified and cauterized. Transverse retinaculum was incised and reflected medially and a medial parapatellar arthrotomy was accomplished. the patella was everted and theprepatellar fat pad resected. The superficial medial collateral  ligament was then elevated from anterior to posterior along the proximal  flare of the tibia and anterior half of the menisci resected. The knee was hyperflexed exposing bone on bone arthritis. Peripheral and notch osteophytes as well as the cruciate ligaments were then resected. We continued to  work our way around posteriorly along  the proximal tibia, and externally  rotated the tibia subluxing it out from underneath the femur. A McHale  retractor was placed through the notch and a lateral Hohmann retractor  placed, and we then drilled through the proximal tibia in line with the  axis of the tibia followed by an intramedullary guide rod and 2-degree  posterior slope cutting guide. The tibial cutting guide was pinned into place  allowing resection of 6 mm of bone medially and about 12 mm of bone  laterally because of her varus deformity. Satisfied with the tibial resection, we then  entered the distal femur 2 mm anterior to the PCL origin with the  intramedullary guide rod and applied the distal femoral cutting guide  set at 11mm, with 5 degrees of valgus. This was pinned along the  epicondylar axis. At this point, the distal femoral cut was accomplished without difficulty. We then sized for a #5R femoral component and pinned the guide in 3 degrees of external rotation.The chamfer  cutting guide was pinned into place. The anterior, posterior, and chamfer cuts were accomplished without difficulty followed by  the Attune RP box cutting guide and the box cut. We also removed posterior osteophytes from the posterior femoral condyles. At this  time, the knee was brought into full extension. We checked our  extension and flexion gaps and found them symmetric at 10mm.  The patella thickness measured at 23 mm. We set the cutting guide at 7.5 and removed the posterior 7.5 mm  of the patella, sized for a 38 button and drilled the lollipop. The knee  was then once again hyperflexed exposing the proximal tibia. We sized for a #4 tibial base plate, applied the smokestack and the conical reamer followed by the the Delta fin keel punch. We then hammered into place the Attune RP trial femoral component, inserted a 5-mm trial bearing, trial patellar button, and took the knee through range of motion from 0-130 degrees. No thumb pressure was required for patellar  tracking. At this point, all trial components were removed, a double batch of DePuy HV cement with 1500 mg of Zinacef was mixed and applied to all bony metallic mating surfaces except for the posterior condyles of the femur itself. In order, we  hammered into place the tibial tray and removed excess cement, the femoral component and removed excess cement, a 5mm Attune bearing  was inserted, and the knee brought to full extension with compression.  The patellar button was clamped into place, and excess cement  removed. While the cement cured the wound was irrigated out with normal saline solution pulse lavage, and medium Hemovac drains were placed from an anterolateral  approach. Ligament stability and patellar tracking were checked and found to be excellent. The parapatellar arthrotomy was closed with  running #1 Vicryl suture. The subcutaneous tissue with 0 and 2-0 undyed  Vicryl suture, and the skin with skin staples. A dressing of  Xeroform,  4 x 4, dressing sponges, Webril, and Ace wrap applied. The patient  awakened, extubated, and taken to recovery room without difficulty.   ROWAN,FRANK J 04/10/2014, 8:42 AM

## 2014-04-10 NOTE — Anesthesia Procedure Notes (Signed)
**Note De-Identified Anne Parks Obfuscation** Anesthesia Regional Block:  Femoral nerve block  Pre-Anesthetic Checklist: ,, timeout performed, Correct Patient, Correct Site, Correct Laterality, Correct Procedure, Correct Position, site marked, Risks and benefits discussed,  Surgical consent,  Pre-op evaluation,  At surgeon's request and post-op pain management  Laterality: Right  Prep: Maximum Sterile Barrier Precautions used, chloraprep and alcohol swabs       Needles:  Injection technique: Single-shot  Needle Type: Stimulator Needle - 80        Needle insertion depth: 5 cm   Additional Needles:  Procedures: nerve stimulator Femoral nerve block  Nerve Stimulator or Paresthesia:  Response: 0.5 mA, 0.1 ms, 5 cm  Additional Responses:   Narrative:  Start time: 04/10/2014 6:55 AM End time: 04/10/2014 7:05 AM Injection made incrementally with aspirations every 5 mL.  Performed by: Personally  Anesthesiologist: Maren BeachGregory E Smith MD  Additional Notes: Pt accepts procedure w/ risks. 12cc 0.5% Marcaine w/ epi w/o difficulty or discomfort. GES

## 2014-04-10 NOTE — Progress Notes (Signed)
Utilization review completed.  

## 2014-04-10 NOTE — Progress Notes (Signed)
Orthopedic Tech Progress Note Patient Details:  [name] 31-Jan-1956 [id number]  CPM Right Knee CPM Right Knee: On Right Knee Flexion (Degrees): 60 Right Knee Extension (Degrees): 0 Additional Comments: Trapeze bar and foot roll   Cammer, Mickie BailJennifer Parks 04/10/2014, 10:20 AM

## 2014-04-10 NOTE — Interval H&P Note (Signed)
History and Physical Interval Note:  04/10/2014 7:12 AM  Anne Parks  has presented today for surgery, with the diagnosis of RIGHT KNEE OSTEOARTHRITIS  The various methods of treatment have been discussed with the patient and family. After consideration of risks, benefits and other options for treatment, the patient has consented to  Procedure(s) with comments: TOTAL KNEE ARTHROPLASTY (Right) - EXPAREL, AVAILABLE BLOCK as a surgical intervention .  The patient's history has been reviewed, patient examined, no change in status, stable for surgery.  I have reviewed the patient's chart and labs.  Questions were answered to the patient's satisfaction.     Nestor LewandowskyOWAN,FRANK J

## 2014-04-10 NOTE — Evaluation (Signed)
Physical Therapy Evaluation Patient Details Name: Anne ArgueDonna C Kimmet MRN: [id number] DOB: [birth year] Today's Date: 04/10/2014   History of Present Illness  58 y.o. female s/p right TKA. Hx of anxiety and anemia.  Clinical Impression  Pt is s/p right TKA resulting in the deficits listed below (see PT Problem List). Pt ambulated up to 5 feet post op day #0, with only mild instability showing in right knee, no instances of buckling. Pt with nausea, no emesis - nurse notified. Pt will benefit from skilled PT to increase their independence and safety with mobility to allow discharge to the venue listed below. Anticipate she will progress nicely and be safe for d/c to home with the care of her son and husband.     Follow Up Recommendations Home health PT;Supervision for mobility/OOB    Equipment Recommendations  None recommended by PT    Recommendations for Other Services OT consult     Precautions / Restrictions Precautions Precautions: Knee Precaution Comments: Reviewed knee precautions and footsie roll use Restrictions Weight Bearing Restrictions: Yes RLE Weight Bearing: Weight bearing as tolerated      Mobility  Bed Mobility Overal bed mobility: Needs Assistance Bed Mobility: Supine to Sit     Supine to sit: Supervision;HOB elevated     General bed mobility comments: Supervision for safety with HOB elevated. VCs for technique. No physical assist needed  Transfers Overall transfer level: Needs assistance Equipment used: Rolling walker (2 wheeled) Transfers: Sit to/from Stand Sit to Stand: Min guard         General transfer comment: Min guard for safety from lowest bed setting. VCs for hand placement. Pt rocks several times for momentum before standing. Educated to put some weight through RLE upon standing for stability.  Ambulation/Gait Ambulation/Gait assistance: Min guard Ambulation Distance (Feet): 5 Feet Assistive device: Rolling walker (2 wheeled) Gait  Pattern/deviations: Step-to pattern;Decreased step length - left;Decreased stance time - right;Antalgic   Gait velocity interpretation: Below normal speed for age/gender General Gait Details: Educated on safe DME use with VCs for sequencing. Mild instability of Rt knee without knee immobilizer however no instances of buckling. Instructed on use of UEs for support and to activate Rt quad in stance phase for knee extension.  Stairs            Wheelchair Mobility    Modified Rankin (Stroke Patients Only)       Balance Overall balance assessment: Needs assistance Sitting-balance support: No upper extremity supported;Feet supported Sitting balance-Leahy Scale: Good     Standing balance support: Single extremity supported Standing balance-Leahy Scale: Poor                               Pertinent Vitals/Pain 4/10 pain Nauseated - no emesis Nurse notified Patient repositioned in chair for comfort.     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 [employer]     Hand Dominance   Dominant Hand: Left  Extremity/Trunk Assessment   Upper Extremity Assessment: Defer to OT evaluation           Lower Extremity Assessment: RLE deficits/detail RLE Deficits / Details: decreased strength and ROM - as expected post-op TKA       Communication   Communication: No difficulties  Cognition Arousal/Alertness:  Awake/alert Behavior During Therapy: WFL for tasks assessed/performed Overall Cognitive Status: Within Functional Limits for tasks assessed                      General Comments      Exercises Total Joint Exercises Ankle Circles/Pumps: AROM;Both;10 reps;Supine Quad Sets: AROM;Right;10 reps;Supine      Assessment/Plan    PT Assessment Patient needs continued PT services  PT Diagnosis Difficulty walking;Acute pain;Abnormality of gait   PT Problem List Decreased strength;Decreased range of motion;Decreased activity tolerance;Decreased balance;Decreased mobility;Decreased knowledge of use of DME;Decreased knowledge of precautions;Pain  PT Treatment Interventions DME instruction;Gait training;Stair training;Functional mobility training;Therapeutic activities;Therapeutic exercise;Balance training;Neuromuscular re-education;Patient/family education;Modalities   PT Goals (Current goals can be found in the Care Plan section) Acute Rehab PT Goals Patient Stated Goal: Go home and back to work PT Goal Formulation: With patient Time For Goal Achievement: 04/17/14 Potential to Achieve Goals: Good    Frequency 7X/week   Barriers to discharge        Co-evaluation               End of Session   Activity Tolerance: Patient tolerated treatment well Patient left: in chair;with call bell/phone within reach;with family/visitor present Nurse Communication: Mobility status;Other (comment) (Nausea)         Time: [id number] PT Time Calculation (min): 24 min   Charges:   PT Evaluation $Initial PT Evaluation Tier I: 1 Procedure PT Treatments $Therapeutic Activity: 8-22 mins   PT G Codes:         Charlsie MerlesLogan Secor Barbour, [place] CarolinaPT [phone]  Berton MountBarbour, Logan S 04/10/2014, 3:57 PM

## 2014-04-10 NOTE — Anesthesia Postprocedure Evaluation (Signed)
  Anesthesia Post-op Note  Patient: Anne Parks  Procedure(s) Performed: Procedure(s): RIGHT TOTAL KNEE ARTHOPLASTY (Right)  Patient Location: PACU  Anesthesia Type:GA combined with regional for post-op pain  Level of Consciousness: awake, alert , oriented and patient cooperative  Airway and Oxygen Therapy: Patient Spontanous Breathing  Post-op Pain: mild  Post-op Assessment: Post-op Vital signs reviewed, Patient's Cardiovascular Status Stable, Respiratory Function Stable, Patent Airway and No signs of Nausea or vomiting  Post-op Vital Signs: stable  Last Vitals:  Filed Vitals:   04/10/14 1045  BP: 146/70  Pulse: 74  Temp: 36.8 C  Resp:     Complications: No apparent anesthesia complications

## 2014-04-10 NOTE — Anesthesia Preprocedure Evaluation (Addendum)
Anesthesia Evaluation  Patient identified by MRN, date of birth, ID band Patient awake    Reviewed: Allergy & Precautions, H&P , NPO status , Patient's Chart, lab work & pertinent test results  Airway       Dental   Pulmonary          Cardiovascular     Neuro/Psych    GI/Hepatic   Endo/Other    Renal/GU      Musculoskeletal   Abdominal   Peds  Hematology   Anesthesia Other Findings   Reproductive/Obstetrics                           Anesthesia Physical Anesthesia Plan  ASA: I  Anesthesia Plan: General   Post-op Pain Management:    Induction: Intravenous  Airway Management Planned: LMA and Oral ETT  Additional Equipment:   Intra-op Plan:   Post-operative Plan: Extubation in OR  Informed Consent: I have reviewed the patients History and Physical, chart, labs and discussed the procedure including the risks, benefits and alternatives for the proposed anesthesia with the patient or authorized representative who has indicated his/her understanding and acceptance.     Plan Discussed with:   Anesthesia Plan Comments:         Anesthesia Quick Evaluation  

## 2014-04-10 NOTE — Plan of Care (Signed)
Problem: Consults Goal: Diagnosis- Total Joint Replacement Primary Total Knee Right     

## 2014-04-10 NOTE — Evaluation (Signed)
Occupational Therapy Evaluation Patient Details Name: Memory ArgueDonna C Godfrey MRN: [id number] DOB: August 12, 1956 Today's Date: 04/10/2014    History of Present Illness 58 y.o. female s/p right TKA. Hx of anxiety and anemia.   Clinical Impression   Pt s/p above. Pt independent with ADLs, PTA. Feel pt will benefit from acute OT to increase independence prior to d/c.     Follow Up Recommendations  No OT follow up;Supervision - Intermittent    Equipment Recommendations  Other (comment) (may need AE)    Recommendations for Other Services       Precautions / Restrictions Precautions Precautions: Knee Precaution Comments: Reviewed precautions Restrictions Weight Bearing Restrictions: Yes RLE Weight Bearing: Weight bearing as tolerated      Mobility Bed Mobility Overal bed mobility: Needs Assistance Bed Mobility: Sit to Supine      Sit to supine: Supervision   Transfers Overall transfer level: Needs assistance Equipment used: Rolling walker (2 wheeled) Transfers: Sit to/from Stand Sit to Stand: Min guard         General transfer comment: Cues for hand placement. Did help position walker closer to pt during toilet transfer.         ADL Overall ADL's : Needs assistance/impaired     Grooming: Wash/dry hands;Supervision/safety;Set up;Standing               Lower Body Dressing: Moderate assistance;Sit to/from stand   Toilet Transfer: Min guard;Ambulation;RW (3 in 1 over commode)           Functional mobility during ADLs: Min guard;Rolling walker General ADL Comments: Educated on safe shoewear and use of bag on walker. Educated on dressing technique and recommended sitting for LB ADLs. Pt ambulated to bathroom to urinate. Educated on benefit of reaching down to don/doff right sock as it increases ROM in knee. Pt unable to don/doff right sock.  Pt thinks she will sponge bathe.     Vision                     Perception     Praxis      Pertinent  Vitals/Pain Pain 4/10. Repositioned in footsie roll.     Hand Dominance Left   Extremity/Trunk Assessment Upper Extremity Assessment Upper Extremity Assessment: Overall WFL for tasks assessed   Lower Extremity Assessment Lower Extremity Assessment: Defer to PT evaluation RLE Deficits / Details: decreased strength and ROM - as expected post-op TKA RLE: Unable to fully assess due to pain       Communication Communication Communication: No difficulties   Cognition Arousal/Alertness: Awake/alert Behavior During Therapy: WFL for tasks assessed/performed Overall Cognitive Status: Within Functional Limits for tasks assessed                     General Comments          Shoulder Instructions      Home Living Family/patient expects to be discharged to:: Private residence Living Arrangements: Spouse/significant other;Children Available Help at Discharge: Family;Available 24 hours/day Type of Home: House Home Access: Stairs to enter Entergy CorporationEntrance Stairs-Number of Steps: 3 Entrance Stairs-Rails: Right Home Layout: Two level;Able to live on main level with bedroom/bathroom Alternate Level Stairs-Number of Steps: 15   Bathroom Shower/Tub: Tub/shower unit;Walk-in shower   Bathroom Toilet: Handicapped height     Home Equipment: Environmental consultantWalker - 2 wheels;Cane - single point;Bedside commode;Tub bench          Prior Functioning/Environment Level of Independence: Independent with assistive device(s)  Comments: used cane for ambulation - works in Engineering geologistlibrary at [employer]    OT Diagnosis: Acute pain   OT Problem List: Decreased range of motion;Decreased knowledge of use of DME or AE;Decreased knowledge of precautions;Pain   OT Treatment/Interventions: Self-care/ADL training;DME and/or AE instruction;Therapeutic activities;Patient/family education;Balance training    OT Goals(Current goals can be found in the care plan section) Acute Rehab OT Goals Patient Stated Goal: not  stated OT Goal Formulation: With patient Time For Goal Achievement: 04/17/14 Potential to Achieve Goals: Good ADL Goals Pt Will Perform Lower Body Bathing: with modified independence;sit to/from stand Pt Will Perform Lower Body Dressing: with modified independence;sit to/from stand Pt Will Transfer to Toilet: with modified independence;ambulating (3 in 1 over commode)  OT Frequency: Min 2X/week   Barriers to D/C:            Co-evaluation              End of Session Equipment Utilized During Treatment: Gait belt;Rolling walker CPM Right Knee CPM Right Knee: Off  Activity Tolerance: Patient tolerated treatment well Patient left: in bed;with call bell/phone within reach   Time: 1734-1755 OT Time Calculation (min): 21 min Charges:  OT General Charges $OT Visit: 1 Procedure OT Evaluation $Initial OT Evaluation Tier I: 1 Procedure OT Treatments $Self Care/Home Management : 8-22 mins G-CodesEarlie Raveling:    Straub, Lindsey L OTR/L [phone] 04/10/2014, 6:23 PM

## 2014-04-11 ENCOUNTER — Encounter (HOSPITAL_COMMUNITY): Payer: Self-pay | Admitting: General Practice

## 2014-04-11 LAB — CBC
HEMATOCRIT: 30.2 % — AB (ref 36.0–46.0)
Hemoglobin: 10 g/dL — ABNORMAL LOW (ref 12.0–15.0)
MCH: 29.1 pg (ref 26.0–34.0)
MCHC: 33.1 g/dL (ref 30.0–36.0)
MCV: 87.8 fL (ref 78.0–100.0)
Platelets: 209 10*3/uL (ref 150–400)
RBC: 3.44 MIL/uL — ABNORMAL LOW (ref 3.87–5.11)
RDW: 14.6 % (ref 11.5–15.5)
WBC: 13.9 10*3/uL — ABNORMAL HIGH (ref 4.0–10.5)

## 2014-04-11 NOTE — Progress Notes (Signed)
Patient ID: [name], female   DOB: Sep 02, 1956, 58 y.o.   MRN: [id number] PATIENT ID: [name]  MRN: [id number]  DOB/AGE:  Sep 02, 1956 / 58 y.o.  1 Day Post-Op Procedure(s) (LRB): RIGHT TOTAL KNEE ARTHOPLASTY (Right)    PROGRESS NOTE Subjective: Patient is alert, oriented, 1x Nausea, no Vomiting, yes passing gas, no Bowel Movement. Taking PO well. Denies SOB, Chest or Calf Pain. Using Incentive Spirometer, PAS in place. Ambulate 5 ft POD 0, CPM 0-60 Patient reports pain as 3 on 0-10 scale  .    Objective: Vital signs in last 24 hours: Filed Vitals:   04/10/14 1301 04/10/14 2013 04/11/14 0045 04/11/14 0535  BP: 111/55 125/72 117/60 120/64  Pulse: 73 88 86 85  Temp: 98.5 F (36.9 C) 97.9 F (36.6 C) 97.8 F (36.6 C) 99.3 F (37.4 C)  TempSrc:      Resp:      Weight:      SpO2: 98% 99% 96% 97%      Intake/Output from previous day: I/O last 3 completed shifts: In: 1771.3 [P.O.:240; I.V.:1531.3] Out: 1575 [Urine:1175; Drains:400]   Intake/Output this shift:     LABORATORY DATA:  Recent Labs  04/11/14 0455  WBC 13.9*  HGB 10.0*  HCT 30.2*  PLT 209    Examination: Neurologically intact ABD soft Neurovascular intact Sensation intact distally Intact pulses distally Dorsiflexion/Plantar flexion intact Incision: no drainage No cellulitis present Compartment soft} Blood and plasma separated in drain indicating minimal recent drainage, drain pulled without difficulty. Assessment:   1 Day Post-Op Procedure(s) (LRB): RIGHT TOTAL KNEE ARTHOPLASTY (Right) ADDITIONAL DIAGNOSIS:    Plan: PT/OT WBAT, CPM 5/hrs day until ROM 0-90 degrees, then D/C CPM DVT Prophylaxis:  SCDx72hrs, ASA 325 mg BID x 2 weeks DISCHARGE PLAN: Home, when passes PT DISCHARGE NEEDS: HHPT, HHRN, CPM, Walker and 3-in-1 comode seat     Anne Parks 04/11/2014, 7:09 AM

## 2014-04-11 NOTE — Progress Notes (Signed)
Physical Therapy Treatment Patient Details Name: Anne Parks MRN: [id number] DOB: January 09, 1956 Today's Date: 04/11/2014    History of Present Illness 58 y.o. female s/p right TKA. Hx of anxiety and anemia.    PT Comments    Patient is progressing well towards physical therapy goals, ambulating up to 80 feet with supervision for safety. Completed stair training this afternoon and performs this task safely and correctly however, pt states she does not feel fully confident with this task and may want to practice again prior to d/c. Will follow-up accordingly. Patient will continue to benefit from skilled physical therapy services to further improve independence with functional mobility. Anticipate she will be ready for d/c home from mobility standpoint tomorrow after one more therapy session.    Follow Up Recommendations  Home health PT;Supervision for mobility/OOB     Equipment Recommendations  None recommended by PT    Recommendations for Other Services OT consult     Precautions / Restrictions Precautions Precautions: Knee Precaution Comments: Reviewed precautions Restrictions Weight Bearing Restrictions: Yes RLE Weight Bearing: Weight bearing as tolerated    Mobility  Bed Mobility Overal bed mobility: Needs Assistance Bed Mobility: Supine to Sit     Supine to sit: Min assist Sit to supine: Supervision   General bed mobility comments: Min assist with HOB flat. Physical assist needed initially to assist RLE to edge of bed.  Transfers Overall transfer level: Needs assistance Equipment used: Rolling walker (2 wheeled) Transfers: Sit to/from Stand Sit to Stand: Supervision Stand pivot transfers: Min guard       General transfer comment: Performed from lowest bed setting, recliner, and BSC. Correctly places hands in safe position. Educated to use RLE to assist with standing by increased WB through this limb.  Ambulation/Gait Ambulation/Gait assistance:  Supervision Ambulation Distance (Feet): 80 Feet Assistive device: Rolling walker (2 wheeled) Gait Pattern/deviations: Step-to pattern;Step-through pattern;Decreased step length - left;Decreased stance time - right;Antalgic;Trunk flexed   Gait velocity interpretation: Below normal speed for age/gender General Gait Details: Continues to shows very antalgic gait pattern. Instructed to maintain upright posture and use UEs as needed but to attempt further WB through RLE. One instance of mild buckling, pt able to correct herself without physical assist from PT.    Stairs Stairs: Yes Stairs assistance: Min guard Stair Management: One rail Right;Step to pattern;Sideways Number of Stairs: 2 (x2) General stair comments: Stair training and education for correct sequencing and technique. Min guard for safety demonstrating good control of right knee with no physical assist required to block. Pt somewhat anxious and may want to practice again prior to d/c for confidence. Husband present and observed training for stair navigation.  Wheelchair Mobility    Modified Rankin (Stroke Patients Only)       Balance                                    Cognition Arousal/Alertness: Awake/alert Behavior During Therapy: WFL for tasks assessed/performed Overall Cognitive Status: Within Functional Limits for tasks assessed                      Exercises      General Comments        Pertinent Vitals/Pain Pain 3/10 at start of therapy - states it is "lower now" at end of therapy. Patient repositioned in chair for comfort.     Home Living  Prior Function            PT Goals (current goals can now be found in the care plan section) Acute Rehab PT Goals PT Goal Formulation: With patient Time For Goal Achievement: 04/17/14 Potential to Achieve Goals: Good Progress towards PT goals: Progressing toward goals    Frequency  7X/week    PT Plan  Current plan remains appropriate    Co-evaluation             End of Session   Activity Tolerance: Patient tolerated treatment well Patient left: in chair;with call bell/phone within reach;with family/visitor present     Time: 1331-1403 PT Time Calculation (min): 32 min  Charges:  $Gait Training: 8-22 mins $Therapeutic Activity: 8-22 mins                    G Codes:     BJ's WholesaleLogan Secor [place]bour, [place]PT [phone]  Berton MountBarbour, Logan S 04/11/2014, 2:33 PM

## 2014-04-11 NOTE — Progress Notes (Signed)
Physical Therapy Treatment Patient Details Name: Anne Parks MRN: [id number] DOB: 09/07/1956 Today's Date: 04/11/2014    History of Present Illness 58 y.o. female s/p right TKA. Hx of anxiety and anemia.    PT Comments    Patient is progressing well towards physical therapy goals, ambulating up to 65 feet with min guard for safety. Patient will continue to benefit from skilled physical therapy services to further improve independence with functional mobility. Plan for stair training this afternoon with physical therapy.   Follow Up Recommendations  Home health PT;Supervision for mobility/OOB     Equipment Recommendations  None recommended by PT    Recommendations for Other Services OT consult     Precautions / Restrictions Precautions Precautions: Knee Restrictions Weight Bearing Restrictions: Yes RLE Weight Bearing: Weight bearing as tolerated    Mobility  Bed Mobility Overal bed mobility: Needs Assistance Bed Mobility: Supine to Sit     Supine to sit: Supervision;HOB elevated     General bed mobility comments: Supervision for safety with HOB elevated. VCs for technique. No physical assist needed  Transfers Overall transfer level: Needs assistance Equipment used: Rolling walker (2 wheeled) Transfers: Sit to/from Stand Sit to Stand: Supervision         General transfer comment: Demonstrated improved sit<>stand today with both hands pushing from bed at lowest bed setting. VCs to lean forward. Requires extra time. Also performed from [facility]SC with no physical assist, but cues for hand placement.  Ambulation/Gait Ambulation/Gait assistance: Min guard Ambulation Distance (Feet): 65 Feet Assistive device: Rolling walker (2 wheeled) Gait Pattern/deviations: Step-to pattern;Step-through pattern;Decreased step length - left;Decreased stance time - right;Ataxic;Trunk flexed     General Gait Details: VCs for upright posture. Very antalgic type gait. Educated on Rt  glute activation in Rt stance phase and to extend Rt knee for quad activation. No instances of buckling.   Stairs            Wheelchair Mobility    Modified Rankin (Stroke Patients Only)       Balance                                    Cognition Arousal/Alertness: Awake/alert Behavior During Therapy: WFL for tasks assessed/performed Overall Cognitive Status: Within Functional Limits for tasks assessed                      Exercises Total Joint Exercises Goniometric ROM: 12-89 degrees Rt knee flexion in sitting.    General Comments        Pertinent Vitals/Pain 5/10 pain - pt states she is waiting until scheduled pain medication time to ask for pain medicine. Patient repositioned in chair for comfort.      Home Living Family/patient expects to be discharged to:: Private residence Living Arrangements: Spouse/significant other                  Prior Function            PT Goals (current goals can now be found in the care plan section) Acute Rehab PT Goals PT Goal Formulation: With patient Time For Goal Achievement: 04/17/14 Potential to Achieve Goals: Good Progress towards PT goals: Progressing toward goals    Frequency  7X/week    PT Plan Current plan remains appropriate    Co-evaluation             End of Session  Activity Tolerance: Patient tolerated treatment well Patient left: in chair;with call bell/phone within reach     Time: 0918-0945 PT Time Calculation (min): 27 min  Charges:  $Gait Training: 8-22 mins $Therapeutic Activity: 8-22 mins                    G Codes:      [facility]aleLog[facility] Secor [place], [place] [phone]  Berton MountBarbour, Logan S 04/11/2014, 10:19 AM

## 2014-04-11 NOTE — Clinical Social Work Note (Signed)
Referred to CSW today for ?SNF. Chart reviewed and have spoken with RNCM who indicates patient plans to d/c home with HH and DME. CSW to sign off- please contact us if SW needs arise. Janet Caldwell, MSW, LCSWA [phone]   

## 2014-04-11 NOTE — Progress Notes (Signed)
Occupational Therapy Treatment Patient Details Name: Anne Parks MRN: [id number] DOB: 24-Jan-1956 Today's Date: 04/11/2014    History of present illness 58 y.o. female s/p right TKA. Hx of anxiety and anemia.   OT comments  Pt. Able to demonstrate LB dressing while seated.  Progressing well with sit/stand and pivot transfers.  Also doing well with bed mobility.    Follow Up Recommendations  No OT follow up;Supervision - Intermittent    Equipment Recommendations    reports she will be borrowing a tub bench from a friend         Precautions / Restrictions Precautions Precautions: Knee Precaution Comments: Reviewed precautions Restrictions Weight Bearing Restrictions: Yes RLE Weight Bearing: Weight bearing as tolerated       Mobility Bed Mobility Overal bed mobility: Needs Assistance Bed Mobility: Sit to Supine     Supine to sit: Supervision;HOB elevated Sit to supine: Supervision   General bed mobility comments: hob flat, no rail.  instructional cues for technique. no physical assistance required  Transfers Overall transfer level: Needs assistance Equipment used: Rolling walker (2 wheeled) Transfers: Sit to/from UGI CorporationStand;Stand Pivot Transfers Sit to Stand: Supervision Stand pivot transfers: Min guard       General transfer comment: cues for backing all the way up to a surface prior to sitting down    Balance                                   ADL Overall ADL's : Needs assistance/impaired                     Lower Body Dressing: Min guard;Sitting/lateral leans   Toilet Transfer: Min guard;Ambulation;RW Toilet Transfer Details (indicate cue type and reason): simulated transfer using recliner to bed Toileting- Clothing Manipulation and Hygiene: Min guard;Sit to/from stand Toileting - Clothing Manipulation Details (indicate cue type and reason): simulated Tub/ Shower Transfer: Tub transfer;Tub Building surveyorbench Tub/Shower Transfer Details (indicate  cue type and reason):  provided demo for tub bench transfer, pt. is borrowing a tub bench from a friend for tub in downstairs b.room Functional mobility during ADLs: Min guard;Rolling walker General ADL Comments: pt. able to don/doff socks b les, but also reports having a reacher to use if needed and will have assistance at home also.  provided demo for tub transfer as pt. reports she will have a tub bench                                      Cognition   Behavior During Therapy: [facility] for tasks assessed/performed Overall Cognitive Status: Within Functional Limits for tasks assessed                                                      Pertinent Vitals/ Pain       States she thinks she is due for pain meds but wants to take with her lunch meal  Home Living Family/patient expects to be discharged to:: Private residence Living Arrangements: Spouse/significant other  Frequency Min 2X/week     Progress Toward Goals  OT Goals(current goals can now be found in the care plan section)  Progress towards OT goals: Progressing toward goals     Plan Discharge plan remains appropriate                     End of Session    Activity Tolerance Patient tolerated treatment well   Patient Left in bed;with call bell/phone within reach             Time: 1113-1140 OT Time Calculation (min): 27 min  Charges: OT General Charges $OT Visit: 1 Procedure OT Treatments $Self Care/Home Management : 23-37 mins  Robet LeuMorris, Takiera Mayo Lorraine, COTA/L 04/11/2014, 11:52 AM

## 2014-04-11 NOTE — Care Management Note (Signed)
[facility] MANAGEMENT NOTE 04/11/2014  Patient:  Anne Parks,Anne Parks   Account Number:  [id number]  Date Initiated:  04/11/2014  Documentation initiated by:  Vance PeperBRADY,SUSAN  Subjective/Objective Assessment:   58 yr old female s/p right total knee arthroplasty.     Action/Plan:   Case manager spoke with patient concerning home Parks and DME needs at discharge. Preoperatively setup with Anne Parks, no changes. Has family support at discharge.   Anticipated DC Date:  04/12/2014   Anticipated DC Plan:  HOME W HOME [facility] SERVICES      DC Planning Services  CM consult      PAC Choice  DURABLE MEDICAL EQUIPMENT  HOME Parks   Choice offered to / List presented to:  [name] Patient   DME arranged  3-N-1  WALKER - ROLLING  [facility]      DME Parks  [employer]     HH arranged  HH-2 PT      Anne Parks  Anne Parks   Status of service:  Completed, signed off Medicare Important Message given?   (If response is "NO", the following Medicare IM given date fields will be blank) Date Medicare IM given:   Date Additional Medicare IM given:    Discharge Disposition:  HOME W HOME Parks SERVICES  Per UR Regulation:  Reviewed for med. necessity/level of Parks/duration of stay  If discussed at Long Length of Stay Meetings, dates discussed:

## 2014-04-12 LAB — CBC
HCT: 27.8 % — ABNORMAL LOW (ref 36.0–46.0)
HEMOGLOBIN: 9.3 g/dL — AB (ref 12.0–15.0)
MCH: 29.2 pg (ref 26.0–34.0)
MCHC: 33.5 g/dL (ref 30.0–36.0)
MCV: 87.1 fL (ref 78.0–100.0)
PLATELETS: 182 10*3/uL (ref 150–400)
RBC: 3.19 MIL/uL — ABNORMAL LOW (ref 3.87–5.11)
RDW: 14.6 % (ref 11.5–15.5)
WBC: 16.4 10*3/uL — ABNORMAL HIGH (ref 4.0–10.5)

## 2014-04-12 NOTE — Progress Notes (Signed)
PATIENT ID: CADI RHINEHART  MRN: [id number]  DOB/AGE:  Oct 20, 1956 / 58 y.o.  2 Days Post-Op Procedure(s) (LRB): RIGHT TOTAL KNEE ARTHOPLASTY (Right)    PROGRESS NOTE Subjective: Patient is alert, oriented, no Nausea, no Vomiting, yes passing gas, no Bowel Movement. Taking PO well, pt up eating in bed. Denies SOB, Chest or Calf Pain. Using Incentive Spirometer, PAS in place. Ambulate WBAT, CPM 0-62 Patient reports pain as 4 on 0-10 scale  .    Objective: Vital signs in last 24 hours: Filed Vitals:   04/11/14 0535 04/11/14 1532 04/11/14 2148 04/12/14 0509  BP: 120/64 112/67 105/65 114/74  Pulse: 85 102 115 105  Temp: 99.3 F (37.4 C) 99.8 F (37.7 C) 99.5 F (37.5 C) 99.2 F (37.3 C)  TempSrc:  Oral Oral Oral  Resp:  18 18 18   Weight:      SpO2: 97% 94% 98% 94%      Intake/Output from previous day: I/O last 3 completed shifts: In: 2951.3 [P.O.:720; I.V.:2231.3] Out: 950 [Urine:750; Drains:200]   Intake/Output this shift:     LABORATORY DATA:  Recent Labs  04/11/14 0455 04/12/14 0523  WBC 13.9* 16.4*  HGB 10.0* 9.3*  HCT 30.2* 27.8*  PLT 209 182    Examination: Neurologically intact Neurovascular intact Sensation intact distally Intact pulses distally Dorsiflexion/Plantar flexion intact Incision: dressing C/D/I No cellulitis present Compartment soft}  Assessment:   2 Days Post-Op Procedure(s) (LRB): RIGHT TOTAL KNEE ARTHOPLASTY (Right) ADDITIONAL DIAGNOSIS:    Plan: PT/OT WBAT, CPM 5/hrs day until ROM 0-90 degrees, then D/C CPM DVT Prophylaxis:  SCDx72hrs, ASA 325 mg BID x 2 weeks DISCHARGE PLAN: Home, today once pt has met PT goals DISCHARGE NEEDS: HHPT, HHRN, CPM, Walker and 3-in-1 comode seat     PHILLIPS, ERIC R 04/12/2014, 7:47 AM

## 2014-04-12 NOTE — Progress Notes (Signed)
Patient [facility] home with husband. [facility] given. All questions were answered.

## 2014-04-12 NOTE — Discharge Summary (Signed)
Patient ID: Anne Parks MRN: [id number] DOB/AGE: [birth year] 60 y.o.  Admit date: 04/10/2014 Discharge date: 04/12/2014  Admission Diagnoses:  Principal Problem:   Arthritis of knee, right   Discharge Diagnoses:  Same  Past Medical History  Diagnosis Date  . Anxiety   . Arthritis   . Anemia   . Family history of anesthesia complication     sister has nausea    Surgeries: Procedure(s): RIGHT TOTAL KNEE ARTHOPLASTY on 04/10/2014   Consultants:    Discharged Condition: Improved  Hospital Course: Anne Parks is an 58 y.o. female who was admitted 04/10/2014 for operative treatment ofArthritis of knee, right. Patient has severe unremitting pain that affects sleep, daily activities, and work/hobbies. After pre-op clearance the patient was taken to the operating room on 04/10/2014 and underwent  Procedure(s): RIGHT TOTAL KNEE ARTHOPLASTY.    Patient was given perioperative antibiotics: Anti-infectives   Start     Dose/Rate Route Frequency Ordered Stop   04/10/14 0800  cefUROXime (ZINACEF) injection  Status:  Discontinued       As needed 04/10/14 0801 04/10/14 0915   04/10/14 0600  ceFAZolin (ANCEF) IVPB 2 g/50 mL premix     2 g 100 mL/hr over 30 Minutes Intravenous On call to O.R. 04/09/14 1314 04/10/14 0728       Patient was given sequential compression devices, early ambulation, and chemoprophylaxis to prevent DVT.  Patient benefited maximally from hospital stay and there were no complications.    Recent vital signs: Patient Vitals for the past 24 hrs:  BP Temp Temp src Pulse Resp SpO2  04/12/14 0509 114/74 mmHg 99.2 F (37.3 C) Oral 105 18 94 %  04/11/14 2148 105/65 mmHg 99.5 F (37.5 C) Oral 115 18 98 %  04/11/14 1532 112/67 mmHg 99.8 F (37.7 C) Oral 102 18 94 %     Recent laboratory studies:  Recent Labs  04/11/14 0455 04/12/14 0523  WBC 13.9* 16.4*  HGB 10.0* 9.3*  HCT 30.2* 27.8*  PLT 209 182     Discharge Medications:     Medication List          ALEVE PO  Take 1-2 tablets by mouth 2 (two) times daily.     aspirin EC 325 MG tablet  Take 1 tablet (325 mg total) by mouth 2 (two) times daily.     CALCIUM + D PO  Take 1 tablet by mouth daily.     diclofenac sodium 1 % Gel  Commonly known as:  VOLTAREN  Apply 2 g topically every morning.     GLUCOSAMINE PO  Take by mouth.     loratadine 10 MG tablet  Commonly known as:  CLARITIN  Take 10 mg by mouth at bedtime.     methocarbamol 500 MG tablet  Commonly known as:  ROBAXIN  Take 1 tablet (500 mg total) by mouth 2 (two) times daily with a meal.     multivitamin with minerals Tabs tablet  Take 1 tablet by mouth daily.     oxyCODONE-acetaminophen 5-325 MG per tablet  Commonly known as:  ROXICET  Take 1 tablet by mouth every 4 (four) hours as needed.     SLOW RELEASE IRON 45 MG Tbcr  Generic drug:  Ferrous Sulfate Dried  Take 45 mg by mouth daily.        Diagnostic Studies: Dg Chest 2 View  03/31/2014   CLINICAL DATA:  Preoperative knee replacement  EXAM: CHEST  2 VIEW  COMPARISON:  None.  FINDINGS: Lungs are clear. Heart size and pulmonary vascularity are normal. No adenopathy.  There is evidence of an old healed fracture of the lateral left clavicle. There are fixation rods in the lower thoracic and visualized lumbar regions. There is mid thoracic levoscoliosis with thoracolumbar dextroscoliosis.  IMPRESSION: No edema or consolidation.  Scoliosis.   Electronically Signed   By: Bretta BangWilliam  Woodruff M.D.   On: 03/31/2014 09:14    Disposition: Final discharge disposition not confirmed      Discharge Instructions   CPM    Complete by:  As directed   Continuous passive motion machine (CPM):      Use the CPM from 0 to 60  for 5 hours per day.      You may increase by 10 degrees per day.  You may break it up into 2 or 3 sessions per day.      Use CPM for 2 weeks or until you are told to stop.     Call MD / Call 911    Complete by:  As directed   If you experience  chest pain or shortness of breath, CALL 911 and be transported to the hospital emergency room.  If you develope a fever above 101 F, pus (white drainage) or increased drainage or redness at the wound, or calf pain, call your surgeon's office.     Change dressing    Complete by:  As directed   Change dressing on 5, then change the dressing daily with sterile 4 x 4 inch gauze dressing and apply TED hose.  You may clean the incision with alcohol prior to redressing.     Constipation Prevention    Complete by:  As directed   Drink plenty of fluids.  Prune juice may be helpful.  You may use a stool softener, such as Colace (over the counter) 100 mg twice a day.  Use MiraLax (over the counter) for constipation as needed.     Diet - low sodium heart healthy    Complete by:  As directed      Discharge instructions    Complete by:  As directed   Follow up in office with Dr. Turner Danielsowan in 2 weeks.     Driving restrictions    Complete by:  As directed   No driving for 2 weeks     Increase activity slowly as tolerated    Complete by:  As directed      Patient may shower    Complete by:  As directed   You may shower without a dressing once there is no drainage.  Do not wash over the wound.  If drainage remains, cover wound with plastic wrap and then shower.           Follow-up Information   Follow up with Nestor LewandowskyOWAN,FRANK J, MD In 2 weeks.   Specialty:  Orthopedic Surgery   Contact information:   [address] [place] [address] [phone]       [facility] ([facility] will [facility] you concerning start date and time for physical therapy)    Specialty:  Home Health Services   Contact information:   [address] [place] [phone]       Follow up with Nestor LewandowskyOWAN,FRANK J, MD In 2 weeks.   Specialty:  Orthopedic Surgery   Contact information:   [address] [place] [address] [phone]  Signed: PHILLIPS, ERIC R 04/12/2014, 7:51 AM

## 2014-04-12 NOTE — Progress Notes (Signed)
Physical Therapy Treatment Patient Details Name: Anne Parks MRN: [id number] DOB: [birth year] Today's Date: 04/12/2014    History of Present Illness 58 y.o. female s/p right TKA. Hx of anxiety and anemia.    PT Comments    Patient is progressing well towards physical therapy goals, ambulating up to 145 feet at supervision level for safety with rolling walker. Completed additional stair training today and pt performs this task appropriately, reporting that she now feels safe navigating steps. Patient will continue to benefit from skilled physical therapy services at home to further improve independence with functional mobility. Pt adequate for d/c from PT standpoint.    Follow Up Recommendations  Home health PT;Supervision for mobility/OOB     Equipment Recommendations  None recommended by PT    Recommendations for Other Services OT consult     Precautions / Restrictions Precautions Precautions: Knee Restrictions Weight Bearing Restrictions: Yes RLE Weight Bearing: Weight bearing as tolerated    Mobility  Bed Mobility Overal bed mobility: Modified Independent             General bed mobility comments: Mod I for supine<>sit with HOB flat, no cues or assist needed. requires extra time  Transfers Overall transfer level: Needs assistance Equipment used: Rolling walker (2 wheeled) Transfers: Sit to/from Stand Sit to Stand: Supervision         General transfer comment: Performed from lowest bed setting, recliner, and BSC. Correctly places hands in safe position. No physical assist needed  Ambulation/Gait Ambulation/Gait assistance: Supervision Ambulation Distance (Feet): 145 Feet Assistive device: Rolling walker (2 wheeled) Gait Pattern/deviations: Step-through pattern;Decreased step length - left;Decreased stance time - right;Antalgic;Trunk flexed     General Gait Details: Frequent cues for upright posture. Demonstrates good control of RW. No loss of balance.  VCs to extend right knee in stance phase.   Stairs Stairs: Yes Stairs assistance: Supervision Stair Management: One rail Right;Step to pattern;Sideways Number of Stairs: 2 General stair comments: Further education for safe stair navigation. Pt performs without any physical assist at supervision level for safety. Husband present and observed safe guarding techniques. No instances of buckling and pt reports feeling confident with her ability to perform this task  Wheelchair Mobility    Modified Rankin (Stroke Patients Only)       Balance                                    Cognition Arousal/Alertness: Awake/alert Behavior During Therapy: WFL for tasks assessed/performed Overall Cognitive Status: Within Functional Limits for tasks assessed                      Exercises      General Comments        Pertinent Vitals/Pain Pt reports pain as "low" no numerical value given Patient repositioned in chair for comfort.     Home Living                      Prior Function            PT Goals (current goals can now be found in the care plan section) Acute Rehab PT Goals PT Goal Formulation: With patient Time For Goal Achievement: 04/17/14 Potential to Achieve Goals: Good Progress towards PT goals: Progressing toward goals    Frequency  7X/week    PT Plan Current plan remains appropriate    Co-evaluation  End of Session   Activity Tolerance: Patient tolerated treatment well Patient left: in chair;with call bell/phone within reach;with family/visitor present     Time: [id number] PT Time Calculation (min): 25 min  Charges:  $Therapeutic Exercise: 23-37 mins                    G Codes:      Charlsie MerlesLogan Secor Shevawn Langenberg, [place] [phone]  Berton MountBarbour, Leigh Blas S 04/12/2014, 9:24 AM

## 2014-10-30 ENCOUNTER — Other Ambulatory Visit: Payer: Self-pay

## 2014-10-30 DIAGNOSIS — Z1231 Encounter for screening mammogram for malignant neoplasm of breast: Secondary | ICD-10-CM

## 2014-12-01 ENCOUNTER — Ambulatory Visit
Admission: RE | Admit: 2014-12-01 | Discharge: 2014-12-01 | Disposition: A | Discharge status: Home / Self Care | Payer: BC Managed Care – PPO | Source: Ambulatory Visit

## 2014-12-01 DIAGNOSIS — Z1231 Encounter for screening mammogram for malignant neoplasm of breast: Secondary | ICD-10-CM

## 2015-12-14 ENCOUNTER — Other Ambulatory Visit: Payer: Self-pay

## 2015-12-14 DIAGNOSIS — Z1231 Encounter for screening mammogram for malignant neoplasm of breast: Secondary | ICD-10-CM

## 2016-01-01 ENCOUNTER — Ambulatory Visit
Admission: RE | Admit: 2016-01-01 | Discharge: 2016-01-01 | Disposition: A | Discharge status: Home / Self Care | Payer: BC Managed Care – PPO | Source: Ambulatory Visit

## 2016-01-01 DIAGNOSIS — Z1231 Encounter for screening mammogram for malignant neoplasm of breast: Secondary | ICD-10-CM

## 2016-01-04 ENCOUNTER — Other Ambulatory Visit: Payer: Self-pay | Admitting: Family Medicine

## 2016-01-04 DIAGNOSIS — R928 Other abnormal and inconclusive findings on diagnostic imaging of breast: Secondary | ICD-10-CM

## 2016-01-11 ENCOUNTER — Ambulatory Visit
Admission: RE | Admit: 2016-01-11 | Discharge: 2016-01-11 | Disposition: A | Discharge status: Home / Self Care | Payer: BC Managed Care – PPO | Source: Ambulatory Visit | Attending: Family Medicine | Admitting: Family Medicine

## 2016-01-11 DIAGNOSIS — R928 Other abnormal and inconclusive findings on diagnostic imaging of breast: Secondary | ICD-10-CM

## 2016-02-21 ENCOUNTER — Other Ambulatory Visit: Payer: Self-pay | Admitting: Orthopedic Surgery

## 2016-03-19 NOTE — Pre-Procedure Instructions (Signed)
LYNZE REDDY  03/19/2016      [id number] [name] [place] [address] Phone: [phone] Fax: [phone]    Your procedure is scheduled on Mon, June 12 @ 10:00 AM  Report to [facility] Admitting at 8:00 AM  Call this number if you have problems the morning of surgery:  [phone]   Remember:  Do not eat food or drink liquids after midnight.               Stop taking your Aspirin,Glucosamine,Vitamins,and Aleve a week prior to surgery. Also No Goody's,BC's,Advil,Motrin,Ibuprofen,Fish Oil,or any Herbal Medications.    Do not wear jewelry, make-up or nail polish.  Do not wear lotions, powders, or perfumes.  You may wear deodorant.  Do not shave 48 hours prior to surgery.    Do not bring valuables to the hospital.  [facility] is not responsible for any belongings or valuables.  Contacts, dentures or bridgework may not be worn into surgery.  Leave your suitcase in the car.  After surgery it may be brought to your room.  For patients admitted to the hospital, discharge time will be determined by your treatment team.  Patients discharged the day of surgery will not be allowed to drive home.    Special instructions:  [place] - Preparing for Surgery  Before surgery, you can play an important role.  Because skin is not sterile, your skin needs to be as free of germs as possible.  You can reduce the number of germs on you skin by washing with CHG (chlorahexidine gluconate) soap before surgery.  CHG is an antiseptic cleaner which kills germs and bonds with the skin to continue killing germs even after washing.  Please DO NOT use if you have an allergy to CHG or antibacterial soaps.  If your skin becomes reddened/irritated stop using the CHG and inform your nurse when you arrive at Short Stay.  Do not shave (including legs and underarms) for at least 48 hours prior to the first CHG shower.  You may shave your  face.  Please follow these instructions carefully:   1.  Shower with CHG Soap the night before surgery and the                                morning of Surgery.  2.  If you choose to wash your hair, wash your hair first as usual with your       normal shampoo.  3.  After you shampoo, rinse your hair and body thoroughly to remove the                      Shampoo.  4.  Use CHG as you would any other liquid soap.  You can apply chg directly       to the skin and wash gently with scrungie or a clean washcloth.  5.  Apply the CHG Soap to your body ONLY FROM THE NECK DOWN.        Do not use on open wounds or open sores.  Avoid contact with your eyes,       ears, mouth and genitals (private parts).  Wash genitals (private parts)       with your normal soap.  6.  Wash thoroughly, paying special attention to the area where your surgery  will be performed.  7.  Thoroughly rinse your body with warm water from the neck down.  8.  DO NOT shower/wash with your normal soap after using and rinsing off       the CHG Soap.  9.  Pat yourself dry with a clean towel.            10.  Wear clean pajamas.            11.  Place clean sheets on your bed the night of your first shower and do not        sleep with pets.  Day of Surgery  Do not apply any lotions/deoderants the morning of surgery.  Please wear clean clothes to the hospital/surgery center.    Please read over the following fact sheets that you were given. Blood Transfusion Information and MRSA Information

## 2016-03-20 ENCOUNTER — Encounter (HOSPITAL_COMMUNITY): Payer: Self-pay

## 2016-03-20 ENCOUNTER — Encounter (HOSPITAL_COMMUNITY)
Admission: RE | Admit: 2016-03-20 | Discharge: 2016-03-20 | Disposition: A | Discharge status: Home / Self Care | Payer: BC Managed Care – PPO | Source: Ambulatory Visit | Attending: Orthopedic Surgery | Admitting: Orthopedic Surgery

## 2016-03-20 DIAGNOSIS — Z01818 Encounter for other preprocedural examination: Secondary | ICD-10-CM | POA: Insufficient documentation

## 2016-03-20 DIAGNOSIS — M1712 Unilateral primary osteoarthritis, left knee: Secondary | ICD-10-CM | POA: Diagnosis not present

## 2016-03-20 DIAGNOSIS — Z0183 Encounter for blood typing: Secondary | ICD-10-CM | POA: Diagnosis not present

## 2016-03-20 DIAGNOSIS — Z01812 Encounter for preprocedural laboratory examination: Secondary | ICD-10-CM | POA: Diagnosis not present

## 2016-03-20 LAB — TYPE AND SCREEN
ABO/RH(D): A POS
Antibody Screen: NEGATIVE

## 2016-03-20 LAB — BASIC METABOLIC PANEL
ANION GAP: 7 (ref 5–15)
BUN: 20 mg/dL (ref 6–20)
CALCIUM: 9.5 mg/dL (ref 8.9–10.3)
CO2: 25 mmol/L (ref 22–32)
Chloride: 109 mmol/L (ref 101–111)
Creatinine, Ser: 0.68 mg/dL (ref 0.44–1.00)
Glucose, Bld: 80 mg/dL (ref 65–99)
Potassium: 4.3 mmol/L (ref 3.5–5.1)
Sodium: 141 mmol/L (ref 135–145)

## 2016-03-20 LAB — CBC WITH DIFFERENTIAL/PLATELET
BASOS ABS: 0 10*3/uL (ref 0.0–0.1)
BASOS PCT: 0 %
Eosinophils Absolute: 0.1 10*3/uL (ref 0.0–0.7)
Eosinophils Relative: 1 %
HEMATOCRIT: 39.9 % (ref 36.0–46.0)
HEMOGLOBIN: 13 g/dL (ref 12.0–15.0)
Lymphocytes Relative: 29 %
Lymphs Abs: 2.4 10*3/uL (ref 0.7–4.0)
MCH: 28.6 pg (ref 26.0–34.0)
MCHC: 32.6 g/dL (ref 30.0–36.0)
MCV: 87.9 fL (ref 78.0–100.0)
Monocytes Absolute: 0.6 10*3/uL (ref 0.1–1.0)
Monocytes Relative: 7 %
NEUTROS ABS: 5.3 10*3/uL (ref 1.7–7.7)
NEUTROS PCT: 63 %
Platelets: 245 10*3/uL (ref 150–400)
RBC: 4.54 MIL/uL (ref 3.87–5.11)
RDW: 14.1 % (ref 11.5–15.5)
WBC: 8.5 10*3/uL (ref 4.0–10.5)

## 2016-03-20 LAB — URINALYSIS, ROUTINE W REFLEX MICROSCOPIC
Bilirubin Urine: NEGATIVE
GLUCOSE, UA: NEGATIVE mg/dL
Hgb urine dipstick: NEGATIVE
KETONES UR: NEGATIVE mg/dL
LEUKOCYTES UA: NEGATIVE
NITRITE: NEGATIVE
PH: 5.5 (ref 5.0–8.0)
PROTEIN: NEGATIVE mg/dL
Specific Gravity, Urine: 1.027 (ref 1.005–1.030)

## 2016-03-20 LAB — PROTIME-INR
INR: 0.94 (ref 0.00–1.49)
Prothrombin Time: 12.8 seconds (ref 11.6–15.2)

## 2016-03-20 LAB — SURGICAL PCR SCREEN
MRSA, PCR: NEGATIVE
STAPHYLOCOCCUS AUREUS: NEGATIVE

## 2016-03-20 LAB — APTT: APTT: 33 s (ref 24–37)

## 2016-03-20 NOTE — Pre-Procedure Instructions (Signed)
    [name]  03/20/2016      [id number] [name] [address] [place] [address] Phone: [phone] Fax: [phone]    Your procedure is scheduled on 03/31/16.  [facility] Ad[facility] at 8 A.M.  Call this number if you have problems the morning of surgery:  [phone]   Remember:  Do not eat food or drink liquids after midnight.  Take these medicines the morning of surgery with A SIP OF WATER --none   Do not wear jewelry, make-up or nail polish.  Do not wear lotions, powders, or perfumes.  You may wear deodorant.  Do not shave 48 hours prior to surgery.  Men may shave face and neck.  Do not bring valuables to the hospital.  [facility] is not responsible for any belongings or valuables.  Contacts, dentures or bridgework may not be worn into surgery.  Leave your suitcase in the car.  After surgery it may be brought to your room.  For patients admitted to the hospital, discharge time will be determined by your treatment team.  Patients discharged the day of surgery will not be allowed to drive home.   Name and phone number of your driver:   Special instructions:    Please read over the following fact sheets that you were given. MRSA Information

## 2016-03-28 NOTE — H&P (Signed)
TOTAL KNEE ADMISSION H&P  Patient is being admitted for left total knee arthroplasty.  Subjective:  Chief Complaint:left knee pain.  HPI: Anne Parks, 60 y.o. female, has a history of pain and functional disability in the left knee due to arthritis and has failed non-surgical conservative treatments for greater than 12 weeks to includeNSAID's and/or analgesics, corticosteriod injections, flexibility and strengthening excercises, use of assistive devices, weight reduction as appropriate and activity modification.  Onset of symptoms was gradual, starting 3 years ago with gradually worsening course since that time. The patient noted no past surgery on the left knee(s).  Patient currently rates pain in the left knee(s) at 10 out of 10 with activity. Patient has night pain, worsening of pain with activity and weight bearing, pain that interferes with activities of daily living, pain with passive range of motion and joint swelling.  Patient has evidence of periarticular osteophytes and joint space narrowing by imaging studies.   There is no active infection.  Patient Active Problem List   Diagnosis Date Noted  . Arthritis of knee, right 04/08/2014   Past Medical History  Diagnosis Date  . Anxiety   . Arthritis   . Anemia   . Family history of anesthesia complication     sister has nausea    Past Surgical History  Procedure Laterality Date  . Back surgery    . Eye surgery      cataract  . Cesarean section  1990  . Total knee arthroplasty Right 04/10/2014    dr Turner Danielsrowan  . Total knee arthroplasty Right 04/10/2014    Procedure: RIGHT TOTAL KNEE ARTHOPLASTY;  Surgeon: Nestor LewandowskyFrank J Rowan, MD;  Location: [facility] OR;  Service: Orthopedics;  Laterality: Right;  . Joint replacement      No prescriptions prior to admission   No Known Allergies  Social History  Substance Use Topics  . Smoking status: Never Smoker   . Smokeless tobacco: Never Used  . Alcohol Use: No     Comment: quit   1995    No  family history on file.   Review of Systems  Constitutional: Negative.   HENT: Negative.   Eyes: Negative.   Respiratory: Negative.   Cardiovascular: Negative.   Gastrointestinal: Negative.   Genitourinary: Negative.   Musculoskeletal: Positive for joint pain.  Skin: Negative.   Neurological: Negative.   Endo/Heme/Allergies: Negative.   Psychiatric/Behavioral: Negative.     Objective:  Physical Exam  Constitutional: She is oriented to person, place, and time. She appears well-developed and well-nourished.  HENT:  Head: Normocephalic and atraumatic.  Eyes: Pupils are equal, round, and reactive to light.  Neck: Normal range of motion. Neck supple.  Cardiovascular: Intact distal pulses.   Respiratory: Effort normal.  Musculoskeletal: She exhibits tenderness.  she has good strength and adequate range of motion in her left knee, ranging from 5-95.  No pain with palpation.  She does have some pigmentation changes of her skin over her mid to proximal shin.  She has no instability.  No pain with valgus or varus stress.  Her calves are soft and nontender.  She is neurovascularly intact distally.  Neurological: She is alert and oriented to person, place, and time.  Skin: Skin is warm and dry.  Psychiatric: She has a normal mood and affect. Her behavior is normal. Judgment and thought content normal.    Vital signs in last 24 hours:    Labs:   Estimated body mass index is 29.26 kg/(m^2) as calculated from  the following:   Height as of 03/31/14:  (1.6 m).   Weight as of 03/31/14: 74.9 kg (165 lb 2 oz).   Imaging Review Plain radiographs demonstrate bilateral AP weight-bearing and a lateral view of the left knee are taken and reviewed in office today.  Patient's right total knee arthroplasty appears be well-placed and well fixed.  Patient's left knee does have near end-stage arthritis of the medial compartment, which is unchanged from previous x-rays  Assessment/Plan:  End  stage arthritis, left knee   The patient history, physical examination, clinical judgment of the provider and imaging studies are consistent with end stage degenerative joint disease of the left knee(s) and total knee arthroplasty is deemed medically necessary. The treatment options including medical management, injection therapy arthroscopy and arthroplasty were discussed at length. The risks and benefits of total knee arthroplasty were presented and reviewed. The risks due to aseptic loosening, infection, stiffness, patella tracking problems, thromboembolic complications and other imponderables were discussed. The patient acknowledged the explanation, agreed to proceed with the plan and consent was signed. Patient is being admitted for inpatient treatment for surgery, pain control, PT, OT, prophylactic antibiotics, VTE prophylaxis, progressive ambulation and ADL's and discharge planning. The patient is planning to be discharged home with home health services

## 2016-03-29 DIAGNOSIS — M1712 Unilateral primary osteoarthritis, left knee: Secondary | ICD-10-CM | POA: Diagnosis present

## 2016-03-30 MED ORDER — DEXTROSE 5 % IV SOLN
3.0000 g | INTRAVENOUS | Status: DC
  Filled 2016-03-30: qty 3000

## 2016-03-31 ENCOUNTER — Encounter (HOSPITAL_COMMUNITY): Payer: Self-pay | Admitting: Surgery

## 2016-03-31 ENCOUNTER — Inpatient Hospital Stay (HOSPITAL_COMMUNITY): Payer: BC Managed Care – PPO | Admitting: Anesthesiology

## 2016-03-31 ENCOUNTER — Encounter (HOSPITAL_COMMUNITY)
Admission: RE | Disposition: A | Discharge status: Home Health | Payer: Self-pay | Source: Ambulatory Visit | Attending: Orthopedic Surgery

## 2016-03-31 ENCOUNTER — Inpatient Hospital Stay (HOSPITAL_COMMUNITY)
Admission: RE | Admit: 2016-03-31 | Discharge: 2016-04-02 | DRG: 470 | Disposition: A | Discharge status: Home Health | Payer: BC Managed Care – PPO | Source: Ambulatory Visit | Attending: Orthopedic Surgery | Admitting: Orthopedic Surgery

## 2016-03-31 DIAGNOSIS — F419 Anxiety disorder, unspecified: Secondary | ICD-10-CM | POA: Diagnosis present

## 2016-03-31 DIAGNOSIS — H3530 Unspecified macular degeneration: Secondary | ICD-10-CM | POA: Diagnosis present

## 2016-03-31 DIAGNOSIS — K219 Gastro-esophageal reflux disease without esophagitis: Secondary | ICD-10-CM | POA: Diagnosis present

## 2016-03-31 DIAGNOSIS — M1712 Unilateral primary osteoarthritis, left knee: Secondary | ICD-10-CM | POA: Diagnosis present

## 2016-03-31 DIAGNOSIS — D649 Anemia, unspecified: Secondary | ICD-10-CM | POA: Diagnosis present

## 2016-03-31 DIAGNOSIS — D62 Acute posthemorrhagic anemia: Secondary | ICD-10-CM | POA: Diagnosis not present

## 2016-03-31 HISTORY — PX: TOTAL KNEE ARTHROPLASTY: SHX125

## 2016-03-31 SURGERY — ARTHROPLASTY, KNEE, TOTAL
Anesthesia: General | Site: Knee | Laterality: Left

## 2016-03-31 MED ORDER — KCL IN DEXTROSE-NACL 20-5-0.45 MEQ/L-%-% IV SOLN
INTRAVENOUS | Status: AC
  Filled 2016-03-31: qty 1000

## 2016-03-31 MED ORDER — SODIUM CHLORIDE 0.9 % IR SOLN
Status: DC | PRN
  Administered 2016-03-31: 1000 mL

## 2016-03-31 MED ORDER — ONDANSETRON HCL 4 MG/2ML IJ SOLN: 4.0000 mg | Freq: Once | INTRAMUSCULAR | Status: DC | PRN

## 2016-03-31 MED ORDER — OXYCODONE HCL 5 MG PO TABS
5.0000 mg | ORAL_TABLET | ORAL | Status: DC | PRN
  Administered 2016-03-31 – 2016-04-02 (×11): 10 mg via ORAL
  Filled 2016-03-31 (×10): qty 2

## 2016-03-31 MED ORDER — PROPOFOL 10 MG/ML IV BOLUS
INTRAVENOUS | Status: DC | PRN
  Administered 2016-03-31: 150 mg via INTRAVENOUS

## 2016-03-31 MED ORDER — METHOCARBAMOL 500 MG PO TABS
500.0000 mg | ORAL_TABLET | Freq: Four times a day (QID) | ORAL | Status: DC | PRN
  Administered 2016-03-31 – 2016-04-01 (×3): 500 mg via ORAL
  Filled 2016-03-31 (×3): qty 1

## 2016-03-31 MED ORDER — CHLORHEXIDINE GLUCONATE 4 % EX LIQD: 60.0000 mL | Freq: Once | CUTANEOUS | Status: DC

## 2016-03-31 MED ORDER — BUPIVACAINE LIPOSOME 1.3 % IJ SUSP
20.0000 mL | Freq: Once | INTRAMUSCULAR | Status: AC
  Administered 2016-03-31: 20 mL
  Filled 2016-03-31: qty 20

## 2016-03-31 MED ORDER — LIDOCAINE HCL (CARDIAC) 20 MG/ML IV SOLN
INTRAVENOUS | Status: DC | PRN
  Administered 2016-03-31: 100 mg via INTRAVENOUS

## 2016-03-31 MED ORDER — DEXAMETHASONE SODIUM PHOSPHATE 10 MG/ML IJ SOLN
INTRAMUSCULAR | Status: AC
  Filled 2016-03-31: qty 1

## 2016-03-31 MED ORDER — TRANEXAMIC ACID 1000 MG/10ML IV SOLN
1000.0000 mg | Freq: Once | INTRAVENOUS | Status: AC
  Administered 2016-03-31: 1000 mg via INTRAVENOUS
  Filled 2016-03-31 (×2): qty 10

## 2016-03-31 MED ORDER — LACTATED RINGERS IV SOLN
INTRAVENOUS | Status: DC
  Administered 2016-03-31 (×2): via INTRAVENOUS

## 2016-03-31 MED ORDER — BUPIVACAINE-EPINEPHRINE 0.25% -1:200000 IJ SOLN
INTRAMUSCULAR | Status: DC | PRN
  Administered 2016-03-31: 60 mL

## 2016-03-31 MED ORDER — ARTIFICIAL TEARS OP OINT
TOPICAL_OINTMENT | OPHTHALMIC | Status: AC
  Filled 2016-03-31: qty 3.5

## 2016-03-31 MED ORDER — ONDANSETRON HCL 4 MG/2ML IJ SOLN
INTRAMUSCULAR | Status: AC
  Filled 2016-03-31: qty 2

## 2016-03-31 MED ORDER — ARTIFICIAL TEARS OP OINT
TOPICAL_OINTMENT | OPHTHALMIC | Status: DC | PRN
  Administered 2016-03-31: 1 via OPHTHALMIC

## 2016-03-31 MED ORDER — SODIUM CHLORIDE 0.9 % IJ SOLN
INTRAMUSCULAR | Status: DC | PRN
  Administered 2016-03-31: 50 mL

## 2016-03-31 MED ORDER — FENTANYL CITRATE (PF) 100 MCG/2ML IJ SOLN
INTRAMUSCULAR | Status: DC | PRN
  Administered 2016-03-31: 25 ug via INTRAVENOUS
  Administered 2016-03-31: 150 ug via INTRAVENOUS
  Administered 2016-03-31 (×3): 25 ug via INTRAVENOUS

## 2016-03-31 MED ORDER — ACETAMINOPHEN 325 MG PO TABS: 650.0000 mg | ORAL_TABLET | Freq: Four times a day (QID) | ORAL | Status: DC | PRN

## 2016-03-31 MED ORDER — METOCLOPRAMIDE HCL 5 MG/ML IJ SOLN: 5.0000 mg | Freq: Three times a day (TID) | INTRAMUSCULAR | Status: DC | PRN

## 2016-03-31 MED ORDER — LIDOCAINE 2% (20 MG/ML) 5 ML SYRINGE
INTRAMUSCULAR | Status: AC
  Filled 2016-03-31: qty 5

## 2016-03-31 MED ORDER — DOCUSATE SODIUM 100 MG PO CAPS
100.0000 mg | ORAL_CAPSULE | Freq: Two times a day (BID) | ORAL | Status: DC
  Administered 2016-03-31 – 2016-04-02 (×4): 100 mg via ORAL
  Filled 2016-03-31 (×4): qty 1

## 2016-03-31 MED ORDER — BUPIVACAINE-EPINEPHRINE (PF) 0.25% -1:200000 IJ SOLN
INTRAMUSCULAR | Status: AC
  Filled 2016-03-31: qty 60

## 2016-03-31 MED ORDER — MENTHOL 3 MG MT LOZG: 1.0000 | LOZENGE | OROMUCOSAL | Status: DC | PRN

## 2016-03-31 MED ORDER — KETOROLAC TROMETHAMINE 30 MG/ML IJ SOLN
INTRAMUSCULAR | Status: AC
  Administered 2016-03-31: 30 mg via INTRAVENOUS
  Filled 2016-03-31: qty 1

## 2016-03-31 MED ORDER — LORATADINE 10 MG PO TABS
10.0000 mg | ORAL_TABLET | Freq: Every day | ORAL | Status: DC
  Administered 2016-03-31 – 2016-04-01 (×2): 10 mg via ORAL
  Filled 2016-03-31 (×2): qty 1

## 2016-03-31 MED ORDER — CEFAZOLIN SODIUM-DEXTROSE 2-3 GM-% IV SOLR
INTRAVENOUS | Status: DC | PRN
  Administered 2016-03-31: 3 g via INTRAVENOUS

## 2016-03-31 MED ORDER — ASPIRIN EC 325 MG PO TBEC: 325.0000 mg | DELAYED_RELEASE_TABLET | Freq: Two times a day (BID) | ORAL | Status: AC

## 2016-03-31 MED ORDER — ONDANSETRON HCL 4 MG PO TABS: 4.0000 mg | ORAL_TABLET | Freq: Four times a day (QID) | ORAL | Status: DC | PRN

## 2016-03-31 MED ORDER — DIPHENHYDRAMINE HCL 12.5 MG/5ML PO ELIX: 12.5000 mg | ORAL_SOLUTION | ORAL | Status: DC | PRN

## 2016-03-31 MED ORDER — ALUM & MAG HYDROXIDE-SIMETH 200-200-20 MG/5ML PO SUSP: 30.0000 mL | ORAL | Status: DC | PRN

## 2016-03-31 MED ORDER — EPHEDRINE SULFATE 50 MG/ML IJ SOLN
INTRAMUSCULAR | Status: DC | PRN
  Administered 2016-03-31 (×2): 5 mg via INTRAVENOUS

## 2016-03-31 MED ORDER — CEFUROXIME SODIUM 1.5 G IJ SOLR
INTRAMUSCULAR | Status: DC | PRN
  Administered 2016-03-31: 1.5 g

## 2016-03-31 MED ORDER — HYDROMORPHONE HCL 1 MG/ML IJ SOLN: 0.2500 mg | INTRAMUSCULAR | Status: DC | PRN

## 2016-03-31 MED ORDER — CEFUROXIME SODIUM 1.5 G IJ SOLR
INTRAMUSCULAR | Status: AC
  Filled 2016-03-31: qty 1.5

## 2016-03-31 MED ORDER — METHOCARBAMOL 1000 MG/10ML IJ SOLN
500.0000 mg | Freq: Four times a day (QID) | INTRAVENOUS | Status: DC | PRN
  Administered 2016-03-31: 500 mg via INTRAVENOUS
  Filled 2016-03-31 (×3): qty 5

## 2016-03-31 MED ORDER — OXYCODONE-ACETAMINOPHEN 5-325 MG PO TABS: 1.0000 | ORAL_TABLET | ORAL | Status: AC | PRN

## 2016-03-31 MED ORDER — FLEET ENEMA 7-19 GM/118ML RE ENEM: 1.0000 | ENEMA | Freq: Once | RECTAL | Status: DC | PRN

## 2016-03-31 MED ORDER — METOCLOPRAMIDE HCL 5 MG PO TABS
5.0000 mg | ORAL_TABLET | Freq: Three times a day (TID) | ORAL | Status: DC | PRN
  Administered 2016-03-31: 10 mg via ORAL
  Filled 2016-03-31: qty 2

## 2016-03-31 MED ORDER — KCL IN DEXTROSE-NACL 20-5-0.45 MEQ/L-%-% IV SOLN
INTRAVENOUS | Status: DC
  Administered 2016-03-31: 12:00:00 via INTRAVENOUS
  Filled 2016-03-31: qty 1000

## 2016-03-31 MED ORDER — BISACODYL 5 MG PO TBEC: 5.0000 mg | DELAYED_RELEASE_TABLET | Freq: Every day | ORAL | Status: DC | PRN

## 2016-03-31 MED ORDER — ASPIRIN EC 325 MG PO TBEC
325.0000 mg | DELAYED_RELEASE_TABLET | Freq: Every day | ORAL | Status: DC
  Administered 2016-04-01 – 2016-04-02 (×2): 325 mg via ORAL
  Filled 2016-03-31 (×2): qty 1

## 2016-03-31 MED ORDER — MIDAZOLAM HCL 2 MG/2ML IJ SOLN
INTRAMUSCULAR | Status: AC
  Administered 2016-03-31: 1 mg
  Filled 2016-03-31: qty 2

## 2016-03-31 MED ORDER — TRANEXAMIC ACID 1000 MG/10ML IV SOLN
1000.0000 mg | INTRAVENOUS | Status: AC
  Administered 2016-03-31: 1000 mg via INTRAVENOUS
  Filled 2016-03-31: qty 10

## 2016-03-31 MED ORDER — SENNOSIDES-DOCUSATE SODIUM 8.6-50 MG PO TABS: 1.0000 | ORAL_TABLET | Freq: Every evening | ORAL | Status: DC | PRN

## 2016-03-31 MED ORDER — HYDROMORPHONE HCL 1 MG/ML IJ SOLN: 0.5000 mg | INTRAMUSCULAR | Status: DC | PRN

## 2016-03-31 MED ORDER — ACETAMINOPHEN 650 MG RE SUPP: 650.0000 mg | Freq: Four times a day (QID) | RECTAL | Status: DC | PRN

## 2016-03-31 MED ORDER — ONDANSETRON HCL 4 MG/2ML IJ SOLN
4.0000 mg | Freq: Four times a day (QID) | INTRAMUSCULAR | Status: DC | PRN
  Filled 2016-03-31: qty 2

## 2016-03-31 MED ORDER — DEXAMETHASONE SODIUM PHOSPHATE 10 MG/ML IJ SOLN
INTRAMUSCULAR | Status: DC | PRN
  Administered 2016-03-31: 4 mg via INTRAVENOUS

## 2016-03-31 MED ORDER — EPHEDRINE 5 MG/ML INJ
INTRAVENOUS | Status: AC
  Filled 2016-03-31: qty 10

## 2016-03-31 MED ORDER — PHENOL 1.4 % MT LIQD: 1.0000 | OROMUCOSAL | Status: DC | PRN

## 2016-03-31 MED ORDER — DEXTROSE-NACL 5-0.45 % IV SOLN: INTRAVENOUS | Status: DC

## 2016-03-31 MED ORDER — FENTANYL CITRATE (PF) 100 MCG/2ML IJ SOLN
INTRAMUSCULAR | Status: AC
  Administered 2016-03-31: 50 ug
  Filled 2016-03-31: qty 2

## 2016-03-31 MED ORDER — ONDANSETRON HCL 4 MG/2ML IJ SOLN
INTRAMUSCULAR | Status: DC | PRN
  Administered 2016-03-31: 4 mg via INTRAVENOUS

## 2016-03-31 MED ORDER — KETOROLAC TROMETHAMINE 30 MG/ML IJ SOLN
30.0000 mg | Freq: Once | INTRAMUSCULAR | Status: AC
  Administered 2016-03-31: 30 mg via INTRAVENOUS

## 2016-03-31 MED ORDER — OXYCODONE HCL 5 MG PO TABS
ORAL_TABLET | ORAL | Status: AC
  Filled 2016-03-31: qty 2

## 2016-03-31 MED ORDER — MIDAZOLAM HCL 5 MG/5ML IJ SOLN
INTRAMUSCULAR | Status: DC | PRN
  Administered 2016-03-31 (×2): 1 mg via INTRAVENOUS

## 2016-03-31 MED ORDER — MIDAZOLAM HCL 2 MG/2ML IJ SOLN
INTRAMUSCULAR | Status: AC
  Filled 2016-03-31: qty 2

## 2016-03-31 MED ORDER — METHOCARBAMOL 500 MG PO TABS: 500.0000 mg | ORAL_TABLET | Freq: Two times a day (BID) | ORAL | Status: AC

## 2016-03-31 MED ORDER — SODIUM CHLORIDE 0.9 % IV SOLN
2000.0000 mg | Freq: Once | INTRAVENOUS | Status: AC
  Administered 2016-03-31: 2000 mg via TOPICAL
  Filled 2016-03-31: qty 20

## 2016-03-31 MED ORDER — MEPERIDINE HCL 25 MG/ML IJ SOLN: 6.2500 mg | INTRAMUSCULAR | Status: DC | PRN

## 2016-03-31 SURGICAL SUPPLY — 56 items
BANDAGE ACE 6X5 VEL STRL LF (GAUZE/BANDAGES/DRESSINGS) ×2 IMPLANT
BANDAGE ESMARK 6X9 LF (GAUZE/BANDAGES/DRESSINGS) ×1 IMPLANT
BLADE SAG 18X100X1.27 (BLADE) ×3 IMPLANT
BLADE SAW SGTL 13X75X1.27 (BLADE) ×3 IMPLANT
BLADE SURG ROTATE 9660 (MISCELLANEOUS) IMPLANT
BNDG CMPR 9X6 STRL LF SNTH (GAUZE/BANDAGES/DRESSINGS) ×1
BNDG CMPR MED 10X6 ELC LF (GAUZE/BANDAGES/DRESSINGS) ×1
BNDG ELASTIC 6X10 VLCR STRL LF (GAUZE/BANDAGES/DRESSINGS) ×3 IMPLANT
BNDG ESMARK 6X9 LF (GAUZE/BANDAGES/DRESSINGS) ×3
BOWL SMART MIX CTS (DISPOSABLE) ×3 IMPLANT
CAP KNEE TOTAL 3 SIGMA ×2 IMPLANT
CEMENT HV SMART SET (Cement) ×6 IMPLANT
COVER SURGICAL LIGHT HANDLE (MISCELLANEOUS) ×3 IMPLANT
CUFF TOURNIQUET SINGLE 34IN LL (TOURNIQUET CUFF) ×3 IMPLANT
CUFF TOURNIQUET SINGLE 44IN (TOURNIQUET CUFF) IMPLANT
DRAPE EXTREMITY T 121X128X90 (DRAPE) ×3 IMPLANT
DRAPE U-SHAPE 47X51 STRL (DRAPES) ×3 IMPLANT
DRSG AQUACEL AG ADV 3.5X10 (GAUZE/BANDAGES/DRESSINGS) ×3 IMPLANT
DURAPREP 26ML APPLICATOR (WOUND CARE) ×6 IMPLANT
ELECT REM PT RETURN 9FT ADLT (ELECTROSURGICAL) ×3
ELECTRODE REM PT RTRN 9FT ADLT (ELECTROSURGICAL) ×1 IMPLANT
EVACUATOR 1/8 PVC DRAIN (DRAIN) IMPLANT
GLOVE BIO SURGEON STRL SZ7.5 (GLOVE) ×3 IMPLANT
GLOVE BIO SURGEON STRL SZ8.5 (GLOVE) ×3 IMPLANT
GLOVE BIOGEL PI IND STRL 8 (GLOVE) ×1 IMPLANT
GLOVE BIOGEL PI IND STRL 9 (GLOVE) ×1 IMPLANT
GLOVE BIOGEL PI INDICATOR 8 (GLOVE) ×2
GLOVE BIOGEL PI INDICATOR 9 (GLOVE) ×2
GOWN STRL REUS W/ TWL LRG LVL3 (GOWN DISPOSABLE) ×1 IMPLANT
GOWN STRL REUS W/ TWL XL LVL3 (GOWN DISPOSABLE) ×2 IMPLANT
GOWN STRL REUS W/TWL LRG LVL3 (GOWN DISPOSABLE) ×3
GOWN STRL REUS W/TWL XL LVL3 (GOWN DISPOSABLE) ×6
HANDPIECE INTERPULSE COAX TIP (DISPOSABLE) ×3
HOOD PEEL AWAY FACE SHEILD DIS (HOOD) ×6 IMPLANT
KIT BASIN OR (CUSTOM PROCEDURE TRAY) ×3 IMPLANT
KIT ROOM TURNOVER OR (KITS) ×3 IMPLANT
MANIFOLD NEPTUNE II (INSTRUMENTS) ×3 IMPLANT
NDL SPNL 18GX3.5 QUINCKE PK (NEEDLE) IMPLANT
NEEDLE SPNL 18GX3.5 QUINCKE PK (NEEDLE) IMPLANT
NS IRRIG 1000ML POUR BTL (IV SOLUTION) ×3 IMPLANT
PACK TOTAL JOINT (CUSTOM PROCEDURE TRAY) ×3 IMPLANT
PAD ARMBOARD 7.5X6 YLW CONV (MISCELLANEOUS) ×6 IMPLANT
SET HNDPC FAN SPRY TIP SCT (DISPOSABLE) ×1 IMPLANT
SUT VIC AB 0 CT1 27 (SUTURE) ×3
SUT VIC AB 0 CT1 27XBRD ANBCTR (SUTURE) ×1 IMPLANT
SUT VIC AB 1 CTX 36 (SUTURE) ×3
SUT VIC AB 1 CTX36XBRD ANBCTR (SUTURE) ×1 IMPLANT
SUT VIC AB 2-0 CT1 27 (SUTURE)
SUT VIC AB 2-0 CT1 TAPERPNT 27 (SUTURE) IMPLANT
SUT VIC AB 3-0 CT1 27 (SUTURE) ×3
SUT VIC AB 3-0 CT1 TAPERPNT 27 (SUTURE) ×1 IMPLANT
SYR 50ML LL SCALE MARK (SYRINGE) ×3 IMPLANT
TOWEL OR 17X24 6PK STRL BLUE (TOWEL DISPOSABLE) ×3 IMPLANT
TOWEL OR 17X26 10 PK STRL BLUE (TOWEL DISPOSABLE) ×3 IMPLANT
TRAY CATH 16FR W/PLASTIC CATH (SET/KITS/TRAYS/PACK) IMPLANT
WATER STERILE IRR 1000ML POUR (IV SOLUTION) ×9 IMPLANT

## 2016-03-31 NOTE — Op Note (Signed)
PATIENT ID:      Anne Parks  MRN:     [id number] DOB/AGE:    1956/03/02 / 60 y.o.       OPERATIVE REPORT    DATE OF PROCEDURE:  03/31/2016       PREOPERATIVE DIAGNOSIS:   LEFT KNEE OSTEOARTHRITIS      Estimated body mass index is 30.12 kg/(m^2) as calculated from the following:   Height as of this encounter:  (1.6 m).   Weight as of this encounter: 77.111 kg (170 lb).                                                        POSTOPERATIVE DIAGNOSIS:   LEFT KNEE OSTEOARTHRITIS                                                                      PROCEDURE:  Procedure(s): TOTAL KNEE ARTHROPLASTY Using DepuyAttune RP implants #5L Femur, #4Tibia, 5 mm Attune RP bearing, 38 Patella     SURGEON: Creta Dorame J    ASSISTANT:   Eric K. [employer]   (Present and scrubbed throughout the case, critical for assistance with exposure, retraction, instrumentation, and closure.)         ANESTHESIA: GET, 20cc Exparel, 20cc 0.5% Marcaine  EBL: 300  FLUID REPLACEMENT: 1500 crystalloid  TOURNIQUET TIME:  Drains: None  Tranexamic Acid: 1gm iv 2 gm topical   COMPLICATIONS:  None         INDICATIONS FOR PROCEDURE: The patient has  LEFT KNEE OSTEOARTHRITIS, Var deformities, XR shows bone on bone arthritis, lateral subluxation of tibia. Patient has failed all conservative measures including anti-inflammatory medicines, narcotics, attempts at  exercise and weight loss, cortisone injections and viscosupplementation.  Risks and benefits of surgery have been discussed, questions answered.   DESCRIPTION OF PROCEDURE: The patient identified by armband, received  IV antibiotics, in the holding area at [facility]. Patient taken to the operating room, appropriate anesthetic  monitors were attached, and GET anesthesia was  induced. Tourniquet  applied high to the operative thigh. Lateral post and foot positioner  applied to the table, the lower extremity was then prepped and draped  in usual  sterile fashion from the toes to the tourniquet. Time-out procedure was performed. We began the operation, with the knee flexed 120 degrees, by making the anterior midline incision starting at handbreadth above the patella going over the patella 1 cm medial to and 4 cm distal to the tibial tubercle. Small bleeders in the skin and the  subcutaneous tissue identified and cauterized. Transverse retinaculum was incised and reflected medially and a medial parapatellar arthrotomy was accomplished. the patella was everted and theprepatellar fat pad resected. The superficial medial collateral  ligament was then elevated from anterior to posterior along the proximal  flare of the tibia and anterior half of the menisci resected. The knee was hyperflexed exposing bone on bone arthritis. Peripheral and notch osteophytes as well as the cruciate ligaments were then resected. We continued to  work our way around posteriorly along the  proximal tibia, and externally  rotated the tibia subluxing it out from underneath the femur. A McHale  retractor was placed through the notch and a lateral Hohmann retractor  placed, and we then drilled through the proximal tibia in line with the  axis of the tibia followed by an intramedullary guide rod and 2-degree  posterior slope cutting guide. The tibial cutting guide, 3 degree posterior sloped, was pinned into place allowing resection of 3 mm of bone medially and 10 mm of bone laterally. Satisfied with the tibial resection, we then  entered the distal femur 2 mm anterior to the PCL origin with the  intramedullary guide rod and applied the distal femoral cutting guide  set at 9 mm, with 5 degrees of valgus. This was pinned along the  epicondylar axis. At this point, the distal femoral cut was accomplished without difficulty. We then sized for a #5L femoral component and pinned the guide in 3 degrees of external rotation. The chamfer cutting guide was pinned into place. The anterior,  posterior, and chamfer cuts were accomplished without difficulty followed by  the Attune RP box cutting guide and the box cut. We also removed posterior osteophytes from the posterior femoral condyles. At this  time, the knee was brought into full extension. We checked our  extension and flexion gaps and found them symmetric for a 5 mm bearing. Distracting in extension with a lamina spreader, the posterior horns of the menisci were removed, and Exparel, diluted to 60 cc, with 20cc NS, and 20cc 0.5% Marcaine,was injected into the capsule and synovium of the knee. The posterior patella cut was accomplished with the 9.5 mm Attune cutting guide, sized for a 38mm dome, and the fixation pegs drilled.The knee  was then once again hyperflexed exposing the proximal tibia. We sized for a # 4 tibial base plate, applied the smokestack and the conical reamer followed by the the Delta fin keel punch. We then hammered into place the Attune RP trial femoral component, drilled the lugs, inserted a  5 mm trial bearing, trial patellar button, and took the knee through range of motion from 0-130 degrees. No thumb pressure was required for patellar Tracking. At this point, the limb was wrapped with an Esmarch bandage and the tourniquet inflated to 350 mmHg. All trial components were removed, mating surfaces irrigated with pulse lavage, and dried with suction and sponges. A double batch of DePuy HV cement with 1500 mg of Zinacef was mixed and applied to all bony metallic mating surfaces except for the posterior condyles of the femur itself. In order, we  hammered into place the tibial tray and removed excess cement, the femoral component and removed excess cement. The final Attune RP bearing  was inserted, and the knee brought to full extension with compression.  The patellar button was clamped into place, and excess cement  removed. While the cement cured the wound was irrigated out with normal saline solution pulse lavage.  Ligament stability and patellar tracking were checked and found to be excellent. The parapatellar arthrotomy was closed with  running #1 Vicryl suture. The subcutaneous tissue with 0 and 2-0 undyed  Vicryl suture, and the skin with running 3-0 SQ vicryl. A dressing of Xeroform,  4 x 4, dressing sponges, Webril, and Ace wrap applied. The patient  awakened, and taken to recovery room without difficulty.   Jay Kempe J 03/31/2016, 11:12 AM

## 2016-03-31 NOTE — Interval H&P Note (Signed)
History and Physical Interval Note:  03/31/2016 9:08 AM  Anne Parks  has presented today for surgery, with the diagnosis of LEFT KNEE OSTEOARTHRITIS  The various methods of treatment have been discussed with the patient and family. After consideration of risks, benefits and other options for treatment, the patient has consented to  Procedure(s): TOTAL KNEE ARTHROPLASTY (Left) as a surgical intervention .  The patient's history has been reviewed, patient examined, no change in status, stable for surgery.  I have reviewed the patient's chart and labs.  Questions were answered to the patient's satisfaction.     Nestor LewandowskyOWAN,Daniah Zaldivar J

## 2016-03-31 NOTE — Anesthesia Preprocedure Evaluation (Addendum)
Anesthesia Evaluation  Patient identified by MRN, date of birth, ID band Patient awake    Reviewed: Allergy & Precautions, H&P , Patient's Chart, lab work & pertinent test results  Airway Mallampati: I  TM Distance: >3 FB Neck ROM: full    Dental  (+) Teeth Intact   Pulmonary neg pulmonary ROS,    Pulmonary exam normal        Cardiovascular negative cardio ROS Normal cardiovascular exam     Neuro/Psych negative neurological ROS     GI/Hepatic negative GI ROS, Neg liver ROS,   Endo/Other  negative endocrine ROS  Renal/GU negative Renal ROS     Musculoskeletal   Abdominal Normal abdominal exam  (+)   Peds  Hematology   Anesthesia Other Findings   Reproductive/Obstetrics negative OB ROS                             Anesthesia Physical Anesthesia Plan  ASA: II  Anesthesia Plan: General   Post-op Pain Management: GA combined w/ Regional for post-op pain   Induction: Intravenous  Airway Management Planned: LMA  Additional Equipment:   Intra-op Plan:   Post-operative Plan:   Informed Consent: I have reviewed the patients History and Physical, chart, labs and discussed the procedure including the risks, benefits and alternatives for the proposed anesthesia with the patient or authorized representative who has indicated his/her understanding and acceptance.     Plan Discussed with: CRNA and Surgeon  Anesthesia Plan Comments:        Anesthesia Quick Evaluation

## 2016-03-31 NOTE — Evaluation (Signed)
Physical Therapy Evaluation Patient Details Name: Anne Parks MRN: [id number] DOB: 1956/02/17 Today's Date: 03/31/2016   History of Present Illness  60 y.o. female now s/p Lt TKA. PMH: anxiety, anemia, Rt TKA 2015.  Clinical Impression  Pt is s/p TKA resulting in the deficits listed below (see PT Problem List).  Pt will benefit from skilled PT to increase their independence and safety with mobility to allow discharge to home with family support.  Pt did ambulate 10 feet X2 with rw and min guard assist.      Follow Up Recommendations Home health PT;Supervision for mobility/OOB    Equipment Recommendations  None recommended by PT    Recommendations for Other Services       Precautions / Restrictions Precautions Precautions: Knee;Fall Precaution Booklet Issued: Yes (comment) Precaution Comments: HEP provided, reviewed knee extension precautions Restrictions Weight Bearing Restrictions: Yes LLE Weight Bearing: Weight bearing as tolerated      Mobility  Bed Mobility Overal bed mobility: Needs Assistance Bed Mobility: Supine to Sit     Supine to sit: HOB elevated;Min guard     General bed mobility comments: pt using rail to assist.   Transfers Overall transfer level: Needs assistance Equipment used: Rolling walker (2 wheeled) Transfers: Sit to/from Stand Sit to Stand: Min guard         General transfer comment: cues for hand placement. Transfers performed from bed and BSC (over toilet)   Ambulation/Gait Ambulation/Gait assistance: Min guard Ambulation Distance (Feet): 10 Feet (10 feet X2) Assistive device: Rolling walker (2 wheeled) Gait Pattern/deviations: Step-to pattern;Decreased stance time - left;Decreased weight shift to left Gait velocity: decreased   General Gait Details: Steady pattern, mild instability but no gross Loss of balance.   Stairs            Wheelchair Mobility    Modified Rankin (Stroke Patients Only)       Balance  Overall balance assessment: Needs assistance Sitting-balance support: No upper extremity supported Sitting balance-Leahy Scale: Good     Standing balance support: Single extremity supported Standing balance-Leahy Scale: Poor Standing balance comment: able to stand at sink with single UE support.                              Pertinent Vitals/Pain Pain Assessment: 0-10 Pain Score: 4  Pain Location: Lt knee Pain Descriptors / Indicators: Aching Pain Intervention(s): Limited activity within patient's tolerance;Monitored during session;Ice applied    Home Living Family/patient expects to be discharged to:: Private residence Living Arrangements: Spouse/significant other, son Available Help at Discharge: Family;Available 24 hours/day Type of Home: House Home Access: Stairs to enter Entrance Stairs-Rails: Right (getting Lt side put on ) Entrance Stairs-Number of Steps: 3-4 Home Layout: Two level Home Equipment: Walker - 2 wheels;Bedside commode;Cane - single point Additional Comments: pt reports that she is planning to stay on the main level initially and not go upstairs. This is what she did with her previous TKA    Prior Function Level of Independence: Independent with assistive device(s)         Comments: SPC     Hand Dominance        Extremity/Trunk Assessment   Upper Extremity Assessment: Overall WFL for tasks assessed           Lower Extremity Assessment: LLE deficits/detail   LLE Deficits / Details: able to perform SLR with lag     Communication   Communication: No difficulties  Cognition Arousal/Alertness: Awake/alert Behavior During Therapy: WFL for tasks assessed/performed Overall Cognitive Status: Within Functional Limits for tasks assessed                      General Comments General comments (skin integrity, edema, etc.): Pt did become nauseated with resulting emesis once sitting in chair.     Exercises         Assessment/Plan    PT Assessment Patient needs continued PT services  PT Diagnosis Difficulty walking   PT Problem List Decreased strength;Decreased range of motion;Decreased activity tolerance;Decreased mobility;Decreased balance  PT Treatment Interventions DME instruction;Gait training;Stair training;Functional mobility training;Therapeutic activities;Therapeutic exercise;Patient/family education   PT Goals (Current goals can be found in the Care Plan section) Acute Rehab PT Goals Patient Stated Goal: go home from the hospital PT Goal Formulation: With patient Time For Goal Achievement: 04/14/16 Potential to Achieve Goals: Good    Frequency 7X/week   Barriers to discharge        Co-evaluation               End of Session Equipment Utilized During Treatment: Gait belt Activity Tolerance: Patient tolerated treatment well Patient left: in chair;with call bell/phone within reach;with family/visitor present (in knee extension) Nurse Communication: Weight bearing status;Mobility status         Time: [id number] PT Time Calculation (min) (ACUTE ONLY): 33 min   Charges:   PT Evaluation $PT Eval Moderate Complexity: 1 Procedure PT Treatments $Therapeutic Activity: 8-22 mins   PT G Codes:        Christiane HaBenjamin J. Edye Hainline, PT, CSCS Pager [phone] Office [phone]  03/31/2016, 4:29 PM

## 2016-03-31 NOTE — Transfer of Care (Signed)
Immediate Anesthesia Transfer of Care Note  Patient: Anne Parks  Procedure(s) Performed: Procedure(s): TOTAL KNEE ARTHROPLASTY (Left)  Patient Location: PACU  Anesthesia Type:GA combined with regional for post-op pain  Level of Consciousness: awake, alert  and oriented  Airway & Oxygen Therapy: Patient Spontanous Breathing and Patient connected to nasal cannula oxygen  Post-op Assessment: Report given to RN, Post -op Vital signs reviewed and stable and Patient moving all extremities X 4  Post vital signs: Reviewed and stable  Last Vitals:  Filed Vitals:   03/31/16 0915 03/31/16 0920  BP: 118/76 120/67  Pulse: 64 67  Temp:    Resp: 15 13    Last Pain: There were no vitals filed for this visit.    Patients Stated Pain Goal: 3 (03/31/16 0829)  Complications: No apparent anesthesia complications

## 2016-03-31 NOTE — Progress Notes (Signed)
Orthopedic Tech Progress Note Patient Details:  Anne Parks [birth year] [id number]  CPM Left Knee CPM Left Knee: On Left Knee Flexion (Degrees): 40 Left Knee Extension (Degrees): 10 Additional Comments: Trapeze bar and foot roll   Saul FordyceJennifer C [name] 03/31/2016, 12:02 PM

## 2016-03-31 NOTE — Discharge Instructions (Signed)

## 2016-03-31 NOTE — Anesthesia Procedure Notes (Addendum)
Anesthesia Regional Block:  Adductor canal block  Pre-Anesthetic Checklist: ,, timeout performed, Correct Patient, Correct Site, Correct Laterality, Correct Procedure, Correct Position, site marked, Risks and benefits discussed,  Surgical consent,  Pre-op evaluation,  At surgeon's request and post-op pain management  Laterality: Left  Prep: chloraprep       Needles:  Injection technique: Single-shot  Needle Type: Echogenic Needle     Needle Length: 9cm 9 cm Needle Gauge: 21 and 21 G    Additional Needles:  Procedures: ultrasound guided (picture in chart) Adductor canal block Narrative:  Start time: 03/31/2016 9:10 AM End time: 03/31/2016 9:15 AM Injection made incrementally with aspirations every 5 mL.  Performed by: Personally  Anesthesiologist: Leilani AbleHATCHETT, FRANKLIN  Additional Notes: No pain on injection. No resistance to injection. Pt dosed in 5cc increments after - aspiration each time. Pt tolerated the procedure well.   Procedure Name: LMA Insertion Date/Time: 03/31/2016 10:00 AM Performed by: Lanell MatarBAKER, Magaret Justo M Pre-anesthesia Checklist: Patient identified, Emergency Drugs available, Suction available and Patient being monitored Patient Re-evaluated:Patient Re-evaluated prior to inductionOxygen Delivery Method: Circle System Utilized Preoxygenation: Pre-oxygenation with 100% oxygen Intubation Type: IV induction Ventilation: Mask ventilation without difficulty LMA: LMA inserted LMA Size: 4.0 Number of attempts: 1 Placement Confirmation: positive ETCO2 Tube secured with: Tape Dental Injury: Teeth and Oropharynx as per pre-operative assessment

## 2016-03-31 NOTE — Progress Notes (Signed)
Orthopedic Tech Progress Note Patient Details:  Anne Parks Mar 03, 1956 [id number]  Patient ID: Anne Parks, female   DOB: Mar 03, 1956, 60 y.o.   MRN: [id number] Applied cpm 10-40  Trinna PostMartinez, Brezlyn Manrique J 03/31/2016, 7:54 PM

## 2016-03-31 NOTE — Anesthesia Postprocedure Evaluation (Signed)
Anesthesia Post Note  Patient: Memory ArgueDonna C Kreuser  Procedure(s) Performed: Procedure(s) (LRB): TOTAL KNEE ARTHROPLASTY (Left)  Patient location during evaluation: PACU Anesthesia Type: General and Regional Level of consciousness: sedated Pain management: pain level controlled Vital Signs Assessment: post-procedure vital signs reviewed and stable Respiratory status: spontaneous breathing Cardiovascular status: stable Postop Assessment: patient able to bend at knees and no signs of nausea or vomiting Anesthetic complications: no     Last Vitals:  Filed Vitals:   03/31/16 1242 03/31/16 1258  BP: 133/83 138/72  Pulse: 70 71  Temp:  36.8 C  Resp: 14 16    Last Pain:  Filed Vitals:   03/31/16 1300  PainSc: 3    Pain Goal: Patients Stated Pain Goal: 3 (03/31/16 0829)               Amarii Amy JR,JOHN Susann GivensFRANKLIN

## 2016-04-01 LAB — BASIC METABOLIC PANEL
ANION GAP: 7 (ref 5–15)
BUN: 11 mg/dL (ref 6–20)
CO2: 24 mmol/L (ref 22–32)
Calcium: 8.8 mg/dL — ABNORMAL LOW (ref 8.9–10.3)
Chloride: 106 mmol/L (ref 101–111)
Creatinine, Ser: 0.66 mg/dL (ref 0.44–1.00)
Glucose, Bld: 129 mg/dL — ABNORMAL HIGH (ref 65–99)
POTASSIUM: 4.6 mmol/L (ref 3.5–5.1)
SODIUM: 137 mmol/L (ref 135–145)

## 2016-04-01 LAB — CBC
HCT: 33.5 % — ABNORMAL LOW (ref 36.0–46.0)
Hemoglobin: 10.6 g/dL — ABNORMAL LOW (ref 12.0–15.0)
MCH: 27.6 pg (ref 26.0–34.0)
MCHC: 31.6 g/dL (ref 30.0–36.0)
MCV: 87.2 fL (ref 78.0–100.0)
PLATELETS: 249 10*3/uL (ref 150–400)
RBC: 3.84 MIL/uL — AB (ref 3.87–5.11)
RDW: 14.2 % (ref 11.5–15.5)
WBC: 17.1 10*3/uL — AB (ref 4.0–10.5)

## 2016-04-01 NOTE — Progress Notes (Signed)
   04/01/16 1520  Clinical Encounter Type  Visited With Patient  Visit Type Initial  Spiritual Encounters  Spiritual Needs Emotional  Stress Factors  Patient Stress Factors Not reviewed  Family Stress Factors Not reviewed  Chaplain visited patient during afternoon rounds.  Chaplain and patient had a warm conversation.  Chaplain shared the availability of spiritual care if needed.

## 2016-04-01 NOTE — Progress Notes (Signed)
Orthopedic Tech Progress Note Patient Details:  Anne ArgueDonna C Parks Aug 14, 1956 [id number]  CPM Left Knee CPM Left Knee: On Left Knee Flexion (Degrees): 40 Left Knee Extension (Degrees): 10 Additional Comments: Trapeze bar and foot roll   Saul FordyceJennifer C Kelbi Renstrom 04/01/2016, 3:15 PM

## 2016-04-01 NOTE — Care Management Note (Signed)
Case Management Note  Patient Details  Name: Anne Parks MRN: [id number] Date of Birth: 17-May-1956  Subjective/Objective:   60 yr old female s/p left total knee arthroplasty.                 Action/Plan: Case manager spoke with patient concerning Home Health and DME needs. Patient was preoperatively setup with [facility], no changes. Patient has rolling walker and 3in1. CPM has been delivered. Will have family support at discharge.   Expected Discharge Date:    04/02/16              Expected Discharge Plan:  Home w Home Health Services  In-House Referral:     Discharge planning Services  CM Consult  Post Acute Care Choice:  Home Health Choice offered to:  Patient  DME Arranged:  CPM DME Agency:  [employer]  HH Arranged:  PT HH Agency:  [facility]  Status of Service:  Completed, signed off  Medicare Important Message Given:    Date Medicare IM Given:    Medicare IM give by:    Date Additional Medicare IM Given:    Additional Medicare Important Message give by:     If discussed at Long Length of Stay Meetings, dates discussed:    Additional Comments:  Anne Parks, Anne Wonder Naomi, RN 04/01/2016, 4:02 PM

## 2016-04-01 NOTE — Progress Notes (Signed)
Physical Therapy Treatment Patient Details Name: Anne Parks MRN: [id number] DOB: 09-23-1956 Today's Date: 04/01/2016    History of Present Illness 60 y.o. female now s/p Lt TKA. PMH: anxiety, anemia, Rt TKA 2015.    PT Comments    Pt making steady progress with PT. Pt ambulated 200 ft with rw and supervision as well as ambulating up/down 10 steps with min guard assistance. PT to continue to follow and progress mobility and independence acutely with HHPT to follow upon D/C to home.   Follow Up Recommendations  Home health PT;Supervision for mobility/OOB     Equipment Recommendations  None recommended by PT    Recommendations for Other Services       Precautions / Restrictions Precautions Precautions: Knee;Fall Restrictions Weight Bearing Restrictions: Yes LLE Weight Bearing: Weight bearing as tolerated    Mobility  Bed Mobility Overal bed mobility: Needs Assistance Bed Mobility: Supine to Sit;Sit to Supine     Supine to sit: Supervision (bed flat min use of rail) Sit to supine: Supervision (min use of rail)      Transfers Overall transfer level: Needs assistance Equipment used: Rolling walker (2 wheeled) Transfers: Sit to/from Stand Sit to Stand: Supervision         General transfer comment: performed from bed, chair and BSC.   Ambulation/Gait Ambulation/Gait assistance: Min guard Ambulation Distance (Feet): 200 Feet Assistive device: Rolling walker (2 wheeled) Gait Pattern/deviations: Step-to pattern Gait velocity: decreased   General Gait Details: cues for even stride length.    Stairs Stairs: Yes Stairs assistance: Min guard Stair Management: One rail Right;Step to pattern;Sideways Number of Stairs: 10 General stair comments: steady pattern, pt reports feeling confident.   Wheelchair Mobility    Modified Rankin (Stroke Patients Only)       Balance Overall balance assessment: Needs assistance Sitting-balance support: No upper  extremity supported Sitting balance-Leahy Scale: Good     Standing balance support: During functional activity Standing balance-Leahy Scale: Fair Standing balance comment: standing at sink to wash hands without using rw for support.                     Cognition Arousal/Alertness: Awake/alert Behavior During Therapy: WFL for tasks assessed/performed Overall Cognitive Status: Within Functional Limits for tasks assessed                      Exercises Total Joint Exercises Ankle Circles/Pumps: AROM;Both;10 reps Quad Sets: Strengthening;Left;10 reps Short Arc Quad: Strengthening;Left;10 reps Heel Slides: AAROM;Left;10 reps Hip ABduction/ADduction: Strengthening;Left;10 reps Straight Leg Raises: Strengthening;Left;10 reps    General Comments        Pertinent Vitals/Pain Pain Assessment: 0-10 Pain Score: 5  Pain Location: Lt knee Pain Descriptors / Indicators: Aching Pain Intervention(s): Limited activity within patient's tolerance;Monitored during session    Home Living                      Prior Function            PT Goals (current goals can now be found in the care plan section) Acute Rehab PT Goals Patient Stated Goal: stay active PT Goal Formulation: With patient Time For Goal Achievement: 04/14/16 Potential to Achieve Goals: Good Progress towards PT goals: Progressing toward goals    Frequency  7X/week    PT Plan Current plan remains appropriate    Co-evaluation             End of Session Equipment Utilized  During Treatment: Gait belt Activity Tolerance: Patient tolerated treatment well Patient left: in bed;in CPM;with call bell/phone within reach;with SCD's reapplied     Time: [id number] PT Time Calculation (min) (ACUTE ONLY): 31 min  Charges:  $Gait Training: 8-22 mins $Therapeutic Exercise: 8-22 mins                    G Codes:      Christiane HaBenjamin J. Murice Barbar, PT, CSCS Pager [phone] Office [phone]  04/01/2016,  3:24 PM

## 2016-04-01 NOTE — Progress Notes (Signed)
Physical Therapy Treatment Patient Details Name: Anne ArgueDonna C Rottman MRN: [id number] DOB: 27-May-1956 Today's Date: 04/01/2016    History of Present Illness 60 y.o. female now s/p Lt TKA. PMH: anxiety, anemia, Rt TKA 2015.    PT Comments    Pt making good progress, able to ambulate 200 feet with min guard to supervision using rw. Anticipate attempting stairs at next session. Continue to anticipate D/C to home with family support following acute stay.   Follow Up Recommendations  Home health PT;Supervision for mobility/OOB     Equipment Recommendations  None recommended by PT    Recommendations for Other Services       Precautions / Restrictions Precautions Precautions: Knee;Fall Precaution Booklet Issued: No Precaution Comments: Reviewed not placing pillow, ice pack or other object under L knee Restrictions Weight Bearing Restrictions: Yes LLE Weight Bearing: Weight bearing as tolerated    Mobility  Bed Mobility      General bed mobility comments: up in chair upon arrival  Transfers Overall transfer level: Needs assistance Equipment used: Rolling walker (2 wheeled) Transfers: Sit to/from Stand Sit to Stand: Supervision         General transfer comment: supervision for safey, no cues needed.   Ambulation/Gait Ambulation/Gait assistance: Min guard;Supervision Ambulation Distance (Feet): 200 Feet Assistive device: Rolling walker (2 wheeled) Gait Pattern/deviations: Step-through pattern;Decreased stance time - left Gait velocity: decreased   General Gait Details: steady pattern, encouraging heel toe pattern.    Stairs            Wheelchair Mobility    Modified Rankin (Stroke Patients Only)       Balance Overall balance assessment: Needs assistance Sitting-balance support: No upper extremity supported Sitting balance-Leahy Scale: Good     Standing balance support: Bilateral upper extremity supported Standing balance-Leahy Scale: Poor Standing  balance comment: using rw for support                    Cognition Arousal/Alertness: Awake/alert Behavior During Therapy: WFL for tasks assessed/performed Overall Cognitive Status: Within Functional Limits for tasks assessed                      Exercises Total Joint Exercises Ankle Circles/Pumps: AROM;Both;10 reps Quad Sets: Strengthening;Left;10 reps Heel Slides: AAROM;Left;10 reps Straight Leg Raises: Strengthening;Left;10 reps Goniometric ROM: 90 degrees flexion    General Comments       Pertinent Vitals/Pain Pain Assessment: 0-10 Pain Score: 4  Pain Location: Lt knee Pain Descriptors / Indicators: Aching Pain Intervention(s): Limited activity within patient's tolerance;Monitored during session    Home Living    Prior Function    PT Goals (current goals can now be found in the care plan section) Acute Rehab PT Goals Patient Stated Goal: to go home PT Goal Formulation: With patient Time For Goal Achievement: 04/14/16 Potential to Achieve Goals: Good Progress towards PT goals: Progressing toward goals    Frequency  7X/week    PT Plan Current plan remains appropriate    Co-evaluation             End of Session Equipment Utilized During Treatment: Gait belt Activity Tolerance: Patient tolerated treatment well Patient left: in chair;with call bell/phone within reach;with family/visitor present (in bone foam)     Time: 4098-11910927-0950 PT Time Calculation (min) (ACUTE ONLY): 23 min  Charges:  $Gait Training: 8-22 mins $Therapeutic Exercise: 8-22 mins  G Codes:      Christiane Ha, PT, CSCS Pager [phone] Office [phone]  04/01/2016, 9:55 AM

## 2016-04-01 NOTE — Progress Notes (Signed)
Orthopedic Tech Progress Note Patient Details:  Anne Parks 26-Feb-1956 [id number]  CPM Left Knee CPM Left Knee: On Left Knee Flexion (Degrees): 40 Left Knee Extension (Degrees): 10 Additional Comments: Trapeze bar and foot roll   Saul FordyceJennifer C Ladana Chavero 04/01/2016, 6:32 PM

## 2016-04-01 NOTE — Progress Notes (Signed)
Occupational Therapy Evaluation/Discharge  Patient Details Name: Anne Parks MRN: [id number] DOB: [birth year] Today's Date: 04/01/2016    History of Present Illness 60 y.o. female now s/p Lt TKA. PMH: anxiety, anemia, Rt TKA 2015.   Clinical Impression   PTA, pt was independent with ADLs and used [facility] for mobility. Pt currently requires supervision for LB ADLs and transfers. Educated pt on knee precautions, compensatory strategies for LB ADLs, and fall prevetion strategies. All education has been completed and pt has no further questions. Pt with no further acute OT needs. OT signing off.    Follow Up Recommendations  No OT follow up;Supervision - Intermittent    Equipment Recommendations  None recommended by OT    Recommendations for Other Services       Precautions / Restrictions Precautions Precautions: Knee;Fall Precaution Booklet Issued: No Precaution Comments: Reviewed not placing pillow, ice pack or other object under L knee Restrictions Weight Bearing Restrictions: Yes LLE Weight Bearing: Weight bearing as tolerated      Mobility Bed Mobility Overal bed mobility: Needs Assistance Bed Mobility: Supine to Sit     Supine to sit: Supervision     General bed mobility comments: HOB flat, no use of bedrails, exited on R side to simulate home environment. No physical assist required, supervision for safety.  Transfers Overall transfer level: Needs assistance Equipment used: Rolling walker (2 wheeled) Transfers: Sit to/from Stand Sit to Stand: Supervision         General transfer comment: Supervision for safety and x1 VC for safe hand placement. No LOB noted or dizziness reported upon standing.    Balance Overall balance assessment: Needs assistance Sitting-balance support: No upper extremity supported;Feet supported Sitting balance-Leahy Scale: Good     Standing balance support: Bilateral upper extremity supported;During functional activity Standing  balance-Leahy Scale: Fair Standing balance comment: Able to maintain balance without UE support for grooming tasks at sink                            ADL Overall ADL's : Needs assistance/impaired     Grooming: Wash/dry hands;Supervision/safety;Standing   Upper Body Bathing: Modified independent;Sitting   Lower Body Bathing: Supervison/ safety;Sit to/from stand   Upper Body Dressing : Modified independent;Sitting   Lower Body Dressing: Supervision/safety;Sit to/from stand;Cueing for compensatory techniques Lower Body Dressing Details (indicate cue type and reason): cues to dressing LLE first and undress it last Toilet Transfer: Supervision/safety;Ambulation;BSC;RW   Toileting- Clothing Manipulation and Hygiene: Supervision/safety;Sitting/lateral lean   Tub/ Shower Transfer: Walk-in shower;Supervision/safety;Cueing for sequencing;Ambulation;Rolling walker Tub/Shower Transfer Details (indicate cue type and reason): cues for proper step sequence Functional mobility during ADLs: Supervision/safety;Rolling walker General ADL Comments: Reviewed knee precautions, compensatory strategies for ADLs, and fall prevention strateiges. No family present for OT eval,.     Vision Vision Assessment?: No apparent visual deficits   Perception     Praxis      Pertinent Vitals/Pain Pain Assessment: 0-10 Pain Score: 4  Pain Location: L knee Pain Descriptors / Indicators: Aching Pain Intervention(s): Limited activity within patient's tolerance;Repositioned;Premedicated before session;Monitored during session     Hand Dominance Left   Extremity/Trunk Assessment Upper Extremity Assessment Upper Extremity Assessment: Overall WFL for tasks assessed   Lower Extremity Assessment Lower Extremity Assessment: LLE deficits/detail LLE Deficits / Details: decreased ROM and strength as expected post op   Cervical / Trunk Assessment Cervical / Trunk Assessment: Normal   Communication  Communication Communication: No difficulties  Cognition Arousal/Alertness: Awake/alert Behavior During Therapy: WFL for tasks assessed/performed Overall Cognitive Status: Within Functional Limits for tasks assessed                     General Comments       Exercises       Shoulder Instructions      Home Living Family/patient expects to be discharged to:: Private residence Living Arrangements: Spouse/significant other Available Help at Discharge: Family;Available 24 hours/day Type of Home: House Home Access: Stairs to enter Entergy CorporationEntrance Stairs-Number of Steps: 3-4 Entrance Stairs-Rails: Right Home Layout: Two level Alternate Level Stairs-Number of Steps: split flight Alternate Level Stairs-Rails: Right Bathroom Shower/Tub: Walk-in shower;Door   Bathroom Toilet: Handicapped height     Home Equipment: Environmental consultantWalker - 2 wheels;Bedside commode;Cane - single point;Shower seat - built in;Hand held shower head   Additional Comments: Pt reports that she will spend the day on the 1st floor and only go up the stairs to her bedroom at night.      Prior Functioning/Environment Level of Independence: Independent with assistive device(s)        Comments: SPC    OT Diagnosis: Acute pain   OT Problem List: Decreased strength;Decreased range of motion;Decreased activity tolerance;Impaired balance (sitting and/or standing);Decreased safety awareness;Decreased knowledge of precautions;Pain   OT Treatment/Interventions:      OT Goals(Current goals can be found in the care plan section) Acute Rehab OT Goals Patient Stated Goal: to go home OT Goal Formulation: With patient Time For Goal Achievement: 04/15/16 Potential to Achieve Goals: Good  OT Frequency:     Barriers to D/C:            Co-evaluation              End of Session Equipment Utilized During Treatment: Gait belt;Rolling walker CPM Left Knee CPM Left Knee: Off Nurse Communication: Mobility  status  Activity Tolerance: Patient tolerated treatment well Patient left: in chair;with call bell/phone within reach   Time: [id number] OT Time Calculation (min): 24 min Charges:  OT General Charges $OT Visit: 1 Procedure OT Evaluation $OT Eval Low Complexity: 1 Procedure OT Treatments $Self Care/Home Management : 8-22 mins G-Codes:    Nils PyleJulia Emmajean Ratledge, OTR/L Pager: [phone]  04/01/2016, 9:39 AM

## 2016-04-01 NOTE — Progress Notes (Signed)
Patient ID: Anne Parks, female   DOB: [birth year], 60 y.o.   MRN: [id number] PATIENT ID: Anne ArgueDonna C Inlow  MRN: [id number]  DOB/AGE:  [birth year] / 60 y.o.  1 Day Post-Op Procedure(s) (LRB): TOTAL KNEE ARTHROPLASTY (Left)    PROGRESS NOTE Subjective: Patient is alert, oriented, x1 Nausea, x1 Vomiting, yes passing gas. Taking PO well. Denies SOB, Chest or Calf Pain. Using Incentive Spirometer, PAS in place. Ambulate WBAT 10 ft, CPM 0-40 Patient reports pain as 4/10 .    Objective: Vital signs in last 24 hours: Filed Vitals:   03/31/16 1258 03/31/16 2022 04/01/16 0102 04/01/16 0607  BP: 138/72 134/72 132/61 135/67  Pulse: 71 79 93 73  Temp: 98.3 F (36.8 C) 98.8 F (37.1 C) 99.1 F (37.3 C) 98.6 F (37 C)  TempSrc: Oral Oral Oral Oral  Resp: 16 16 16 16   Height:      Weight:      SpO2: 100% 100% 98% 99%      Intake/Output from previous day: I/O last 3 completed shifts: In: 1040 [P.O.:240; I.V.:800] Out: 100 [Blood:100]   Intake/Output this shift:     LABORATORY DATA:  Recent Labs  04/01/16 0536  WBC 17.1*  HGB 10.6*  HCT 33.5*  PLT 249    Examination: Neurologically intact ABD soft Neurovascular intact Sensation intact distally Intact pulses distally Dorsiflexion/Plantar flexion intact Incision: dressing C/D/I No cellulitis present Compartment soft}  Assessment:   1 Day Post-Op Procedure(s) (LRB): TOTAL KNEE ARTHROPLASTY (Left) ADDITIONAL DIAGNOSIS: Expected Acute Blood Loss Anemia, Gerd, Macular degeneration  Plan: PT/OT WBAT, CPM 5/hrs day until ROM 0-90 degrees, then D/C CPM DVT Prophylaxis:  SCDx72hrs, ASA 325 mg BID x 2 weeks DISCHARGE PLAN: Home DISCHARGE NEEDS: HHPT, CPM, Walker and 3-in-1 comode seat     Alva Kuenzel J 04/01/2016, 7:15 AM

## 2016-04-02 ENCOUNTER — Encounter (HOSPITAL_COMMUNITY): Payer: Self-pay | Admitting: Orthopedic Surgery

## 2016-04-02 LAB — CBC
HEMATOCRIT: 30.9 % — AB (ref 36.0–46.0)
Hemoglobin: 9.9 g/dL — ABNORMAL LOW (ref 12.0–15.0)
MCH: 28.3 pg (ref 26.0–34.0)
MCHC: 32 g/dL (ref 30.0–36.0)
MCV: 88.3 fL (ref 78.0–100.0)
Platelets: 214 10*3/uL (ref 150–400)
RBC: 3.5 MIL/uL — ABNORMAL LOW (ref 3.87–5.11)
RDW: 14.3 % (ref 11.5–15.5)
WBC: 14.5 10*3/uL — ABNORMAL HIGH (ref 4.0–10.5)

## 2016-04-02 NOTE — Progress Notes (Signed)
Orthopedic Tech Progress Note Patient Details:  Memory ArgueDonna C Wotton [birth year] [id number]  Patient ID: Memory Argueonna C Vanwingerden, female   DOB: [birth year], 60 y.o.   MRN: [id number] Applied cpm 0-50  Trinna PostMartinez, Lenorris Karger J 04/02/2016, 6:26 AM

## 2016-04-02 NOTE — Progress Notes (Signed)
Physical Therapy Treatment Patient Details Name: Anne ArgueDonna C Witucki MRN: [id number] DOB: [birth year] Today's Date: 04/02/2016    History of Present Illness 60 y.o. female now s/p Lt TKA. PMH: anxiety, anemia, Rt TKA 2015.    PT Comments    Pt making progress with PT, anticipate D/C to home with family support following acute stay. Patient denies any questions or concerns.     Follow Up Recommendations  Home health PT;Supervision for mobility/OOB     Equipment Recommendations  None recommended by PT    Recommendations for Other Services       Precautions / Restrictions Precautions Precautions: Knee;Fall Restrictions Weight Bearing Restrictions: Yes LLE Weight Bearing: Weight bearing as tolerated    Mobility  Bed Mobility               General bed mobility comments: up in chair upon arrival  Transfers Overall transfer level: Needs assistance Equipment used: Rolling walker (2 wheeled) Transfers: Sit to/from Stand Sit to Stand: Supervision         General transfer comment: stable technique demonstrated, supervision for safety.   Ambulation/Gait Ambulation/Gait assistance: Min guard Ambulation Distance (Feet): 175 Feet Assistive device: Rolling walker (2 wheeled) Gait Pattern/deviations: Step-through pattern;Decreased step length - right;Decreased weight shift to left Gait velocity: decreased   General Gait Details: cues for even stride length.    Stairs         General stair comments: declined, reports feeling confident  Wheelchair Mobility    Modified Rankin (Stroke Patients Only)       Balance Overall balance assessment: Needs assistance Sitting-balance support: No upper extremity supported Sitting balance-Leahy Scale: Good     Standing balance support: During functional activity Standing balance-Leahy Scale: Fair Standing balance comment: using rw with ambulation                    Cognition Arousal/Alertness:  Awake/alert Behavior During Therapy: WFL for tasks assessed/performed Overall Cognitive Status: Within Functional Limits for tasks assessed                      Exercises Total Joint Exercises Ankle Circles/Pumps: AROM;Both;10 reps Quad Sets: Strengthening;Left;10 reps Short Arc Quad: Strengthening;Left;10 reps Heel Slides: AAROM;Left;10 reps Hip ABduction/ADduction: Strengthening;Left;10 reps Straight Leg Raises: Strengthening;Left;10 reps (min assist) Goniometric ROM: 10-80 degrees    General Comments        Pertinent Vitals/Pain Pain Assessment: 0-10 Pain Score: 6  Pain Location: Lt knee  Pain Descriptors / Indicators: Aching;Sore Pain Intervention(s): Limited activity within patient's tolerance;Monitored during session    Home Living                      Prior Function            PT Goals (current goals can now be found in the care plan section) Acute Rehab PT Goals Patient Stated Goal: go home today.  PT Goal Formulation: With patient Time For Goal Achievement: 04/14/16 Potential to Achieve Goals: Good Progress towards PT goals: Progressing toward goals    Frequency  7X/week    PT Plan Current plan remains appropriate    Co-evaluation             End of Session Equipment Utilized During Treatment: Gait belt Activity Tolerance: Patient tolerated treatment well Patient left: in chair;with call bell/phone within reach (in bone foam)     Time: 4098-11910941-1005 PT Time Calculation (min) (ACUTE ONLY): 24 min  Charges:  $Gait  Training: 8-22 mins $Therapeutic Exercise: 8-22 mins                    G Codes:      Christiane Ha, PT, CSCS Pager [phone] Office [phone]  04/02/2016, 10:24 AM

## 2016-04-02 NOTE — Discharge Summary (Signed)
Patient ID: Anne Parks MRN: [id number] DOB/AGE: 24-Nov-1955 60 y.o.  Admit date: 03/31/2016 Discharge date: 04/02/2016  Admission Diagnoses:  Principal Problem:   Primary osteoarthritis of left knee   Discharge Diagnoses:  Same  Past Medical History  Diagnosis Date  . Anxiety   . Arthritis   . Anemia   . Family history of anesthesia complication     sister has nausea    Surgeries: Procedure(s): TOTAL KNEE ARTHROPLASTY on 03/31/2016   Consultants:    Discharged Condition: Improved  Hospital Course: Anne Parks is an 60 y.o. female who was admitted 03/31/2016 for operative treatment ofPrimary osteoarthritis of left knee. Patient has severe unremitting pain that affects sleep, daily activities, and work/hobbies. After pre-op clearance the patient was taken to the operating room on 03/31/2016 and underwent  Procedure(s): TOTAL KNEE ARTHROPLASTY.    Patient was given perioperative antibiotics: Anti-infectives    Start     Dose/Rate Route Frequency Ordered Stop   03/31/16 1055  cefUROXime (ZINACEF) injection  Status:  Discontinued       As needed 03/31/16 1105 03/31/16 1138   03/30/16 1151  ceFAZolin (ANCEF) 3 g in dextrose 5 % 50 mL IVPB  Status:  Discontinued     3 g 130 mL/hr over 30 Minutes Intravenous On call to O.R. 03/30/16 1151 03/31/16 1308       Patient was given sequential compression devices, early ambulation, and chemoprophylaxis to prevent DVT.  Patient benefited maximally from hospital stay and there were no complications.    Recent vital signs: Patient Vitals for the past 24 hrs:  BP Temp Temp src Pulse Resp SpO2  04/02/16 0500 138/72 mmHg 99.2 F (37.3 C) Oral 99 18 95 %  04/01/16 2031 (!) 136/59 mmHg 99.9 F (37.7 C) Oral 87 18 99 %  04/01/16 1259 (!) 129/52 mmHg 98.4 F (36.9 C) Oral 86 16 98 %     Recent laboratory studies:  Recent Labs  04/01/16 0536 04/02/16 0614  WBC 17.1* 14.5*  HGB 10.6* 9.9*  HCT 33.5* 30.9*  PLT 249 214  NA  137  --   K 4.6  --   CL 106  --   CO2 24  --   BUN 11  --   CREATININE 0.66  --   GLUCOSE 129*  --   CALCIUM 8.8*  --      Discharge Medications:     Medication List    STOP taking these medications        ALEVE PO      TAKE these medications        aspirin EC 325 MG tablet  Take 1 tablet (325 mg total) by mouth 2 (two) times daily.     diclofenac sodium 1 % Gel  Commonly known as:  VOLTAREN  Apply 2 g topically every morning.     GLUCOSAMINE PO  Take 1 tablet by mouth 2 (two) times daily.     loratadine 10 MG tablet  Commonly known as:  CLARITIN  Take 10 mg by mouth at bedtime.     methocarbamol 500 MG tablet  Commonly known as:  ROBAXIN  Take 1 tablet (500 mg total) by mouth 2 (two) times daily with a meal.     multivitamin with minerals Tabs tablet  Take 1 tablet by mouth daily.     oxyCODONE-acetaminophen 5-325 MG tablet  Commonly known as:  ROXICET  Take 1 tablet by mouth every 4 (four) hours as needed.  Diagnostic Studies: Dg Chest 2 View  03/20/2016  CLINICAL DATA:  Preop for knee replacement EXAM: CHEST  2 VIEW COMPARISON:  Chest x-ray of 03/31/2014 FINDINGS: No active infiltrate or effusion is seen. Mediastinal and hilar contours are unremarkable. The heart is within normal limits in size. Harrington rods are noted within the lower thoracic and upper lumbar spine. IMPRESSION: No active cardiopulmonary disease. Electronically Signed   By: Dwyane DeePaul  Barry M.D.   On: 03/20/2016 11:18    Disposition: 06-Home-Health Care Svc      Discharge Instructions    CPM    Complete by:  As directed   Continuous passive motion machine (CPM):      Use the CPM from 0 to 60  for 5 hours per day.      You may increase by 10 degrees per day.  You may break it up into 2 or 3 sessions per day.      Use CPM for 2 weeks or until you are told to stop.     Call MD / Call 911    Complete by:  As directed   If you experience chest pain or shortness of breath, CALL 911  and be transported to the hospital emergency room.  If you develope a fever above 101 F, pus (white drainage) or increased drainage or redness at the wound, or calf pain, call your surgeon's office.     Constipation Prevention    Complete by:  As directed   Drink plenty of fluids.  Prune juice may be helpful.  You may use a stool softener, such as Colace (over the counter) 100 mg twice a day.  Use MiraLax (over the counter) for constipation as needed.     Diet - low sodium heart healthy    Complete by:  As directed      Driving restrictions    Complete by:  As directed   No driving for 2 weeks     Increase activity slowly as tolerated    Complete by:  As directed      Patient may shower    Complete by:  As directed   You may shower without a dressing once there is no drainage.  Do not wash over the wound.  If drainage remains, cover wound with plastic wrap and then shower.           Follow-up Information    Follow up with Nestor LewandowskyOWAN,FRANK J, MD In 2 weeks.   Specialty:  Orthopedic Surgery   Contact information:   [address] [place] [address] [phone]       Follow up with [facility].   Why:  Somoeone from [facility] will contact you to arrange start date and time for therapy   Contact information:   [address] [place] [address] [phone]        Signed: Henry RusselHILLIPS, Gabriellia Rempel R 04/02/2016, 7:58 AM

## 2016-04-02 NOTE — Progress Notes (Signed)
Pt discharged to home via wheelchair accompanied by significant other. No change from AM assessment. Pt verb understanding of d/c teachings and agrees to comply. Pt states she has equipment at home. Pt completed therapy here. Pt is [facility] with [facility].

## 2016-04-02 NOTE — Progress Notes (Signed)
PATIENT ID: Anne Parks  MRN: [id number]  DOB/AGE:  23-Jun-1956 / 60 y.o.  2 Days Post-Op Procedure(s) (LRB): TOTAL KNEE ARTHROPLASTY (Left)    PROGRESS NOTE Subjective: Patient is alert, oriented, no Nausea, no Vomiting, yes passing gas. Taking PO well. Denies SOB, Chest or Calf Pain. Using Incentive Spirometer, PAS in place. Ambulate WBAT with pt walking 200 ft and 10 steps, CPM 0-40 Patient reports pain as 6/10 .    Objective: Vital signs in last 24 hours: Filed Vitals:   04/01/16 0607 04/01/16 1259 04/01/16 2031 04/02/16 0500  BP: 135/67 129/52 136/59 138/72  Pulse: 73 86 87 99  Temp: 98.6 F (37 C) 98.4 F (36.9 C) 99.9 F (37.7 C) 99.2 F (37.3 C)  TempSrc: Oral Oral Oral Oral  Resp: 16 16 18 18   Height:      Weight:      SpO2: 99% 98% 99% 95%      Intake/Output from previous day: I/O last 3 completed shifts: In: 480 [P.O.:480] Out: -    Intake/Output this shift:     LABORATORY DATA:  Recent Labs  04/01/16 0536 04/02/16 0614  WBC 17.1* 14.5*  HGB 10.6* 9.9*  HCT 33.5* 30.9*  PLT 249 214  NA 137  --   K 4.6  --   CL 106  --   CO2 24  --   BUN 11  --   CREATININE 0.66  --   GLUCOSE 129*  --   CALCIUM 8.8*  --     Examination: Neurologically intact Neurovascular intact Sensation intact distally Intact pulses distally Dorsiflexion/Plantar flexion intact Incision: dressing C/D/I and no drainage No cellulitis present Compartment soft}  Assessment:   2 Days Post-Op Procedure(s) (LRB): TOTAL KNEE ARTHROPLASTY (Left) ADDITIONAL DIAGNOSIS: Expected Acute Blood Loss Anemia, Gerd, Macular degeneration  Plan: PT/OT WBAT, CPM 5/hrs day until ROM 0-90 degrees, then D/C CPM DVT Prophylaxis:  SCDx72hrs, ASA 325 mg BID x 2 weeks DISCHARGE PLAN: Home DISCHARGE NEEDS: HHPT, CPM, Walker and 3-in-1 comode seat     Keyandre Pileggi R 04/02/2016, 7:55 AM

## 2016-07-02 ENCOUNTER — Other Ambulatory Visit: Payer: Self-pay | Admitting: Family Medicine

## 2016-07-02 DIAGNOSIS — R921 Mammographic calcification found on diagnostic imaging of breast: Secondary | ICD-10-CM

## 2016-07-14 ENCOUNTER — Ambulatory Visit
Admission: RE | Admit: 2016-07-14 | Discharge: 2016-07-14 | Disposition: A | Discharge status: Home / Self Care | Payer: BC Managed Care – PPO | Source: Ambulatory Visit | Attending: Family Medicine | Admitting: Family Medicine

## 2016-07-14 ENCOUNTER — Other Ambulatory Visit: Payer: Self-pay | Admitting: Family Medicine

## 2016-07-14 DIAGNOSIS — R921 Mammographic calcification found on diagnostic imaging of breast: Secondary | ICD-10-CM

## 2017-04-29 IMAGING — CR DG CHEST 2V
2 series · 2 of 2 positions shown · non-contrast
Comparison: Chest x-ray of 03/31/2014

CLINICAL DATA: Preop for knee replacement

EXAM:
CHEST  2 VIEW

[w chest pa]
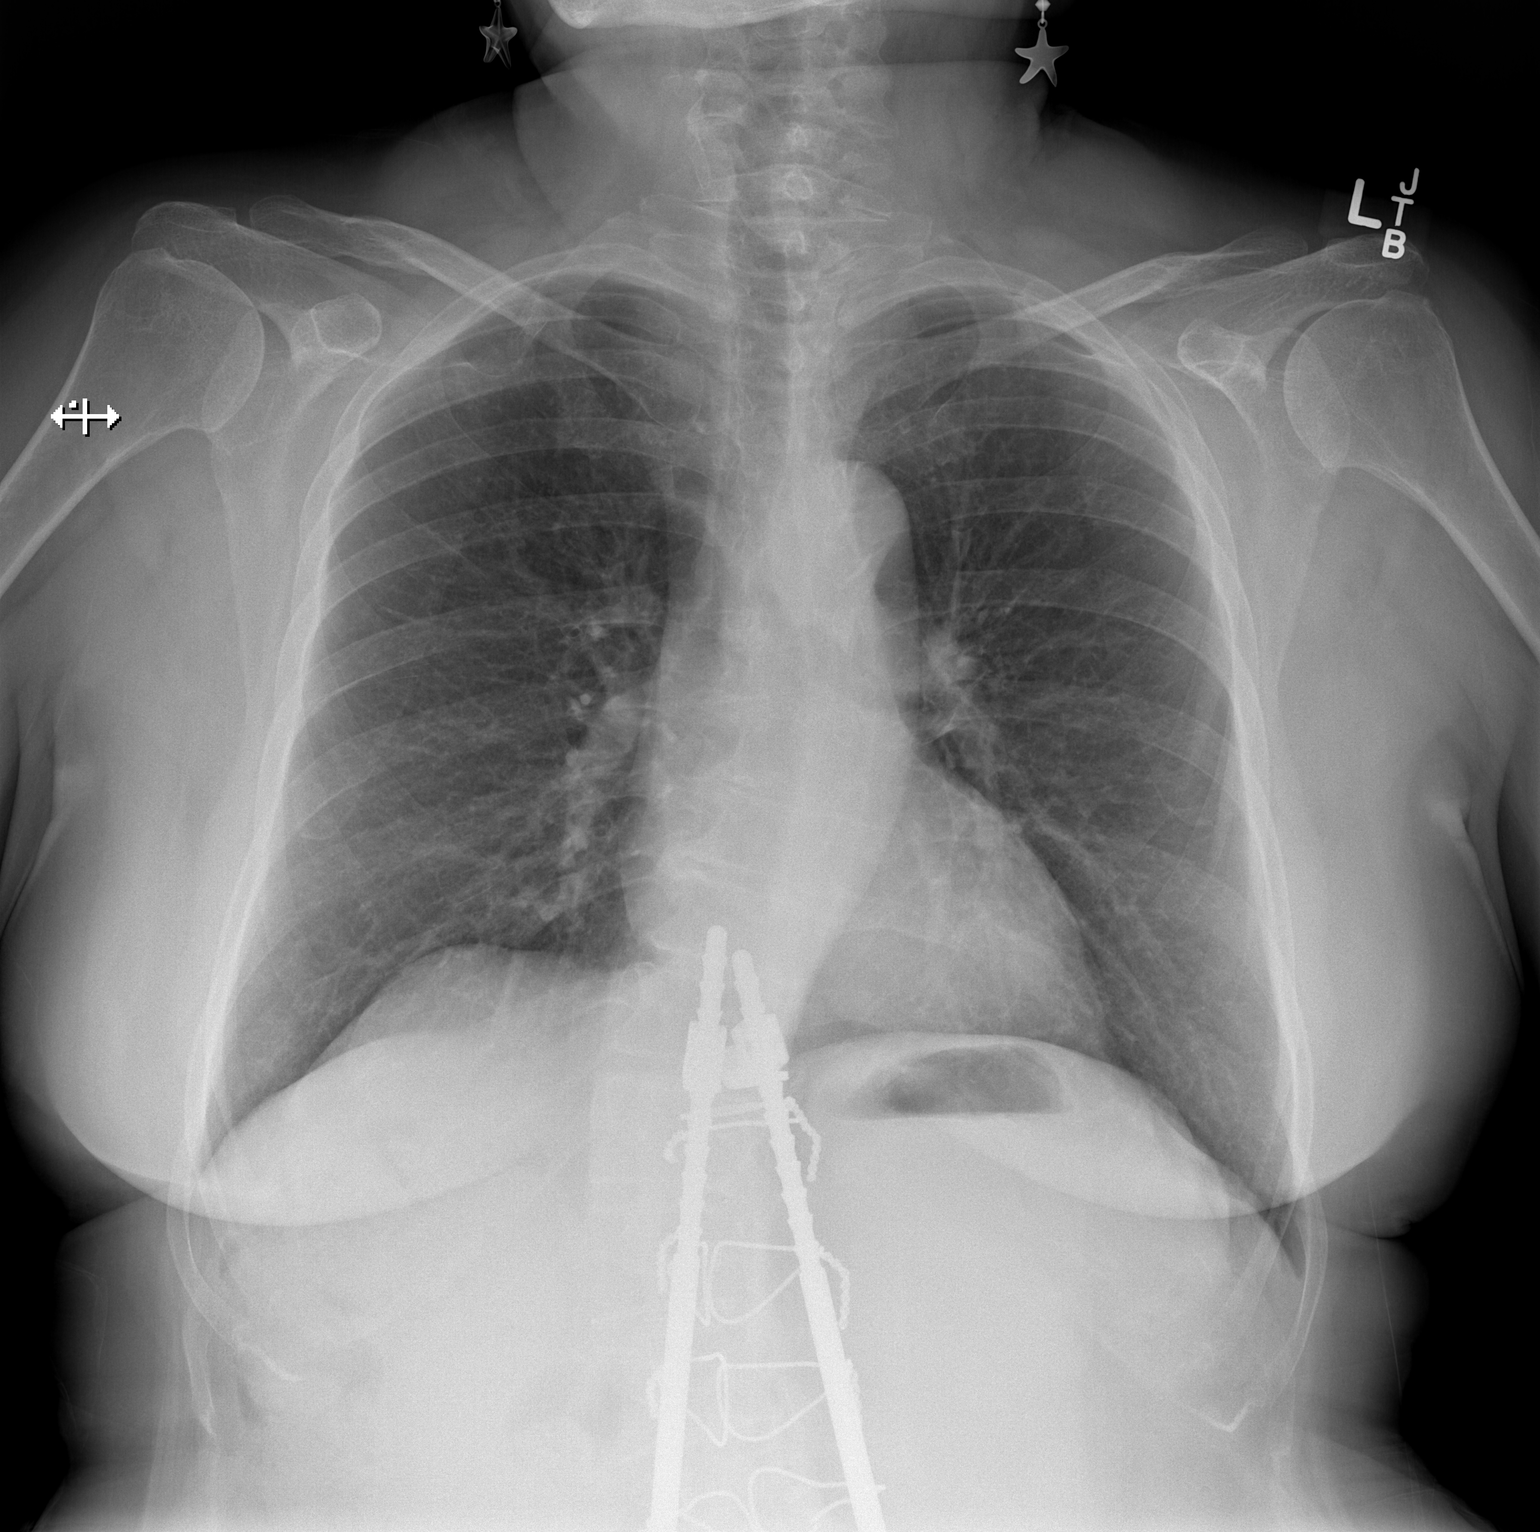

[w chest lat]
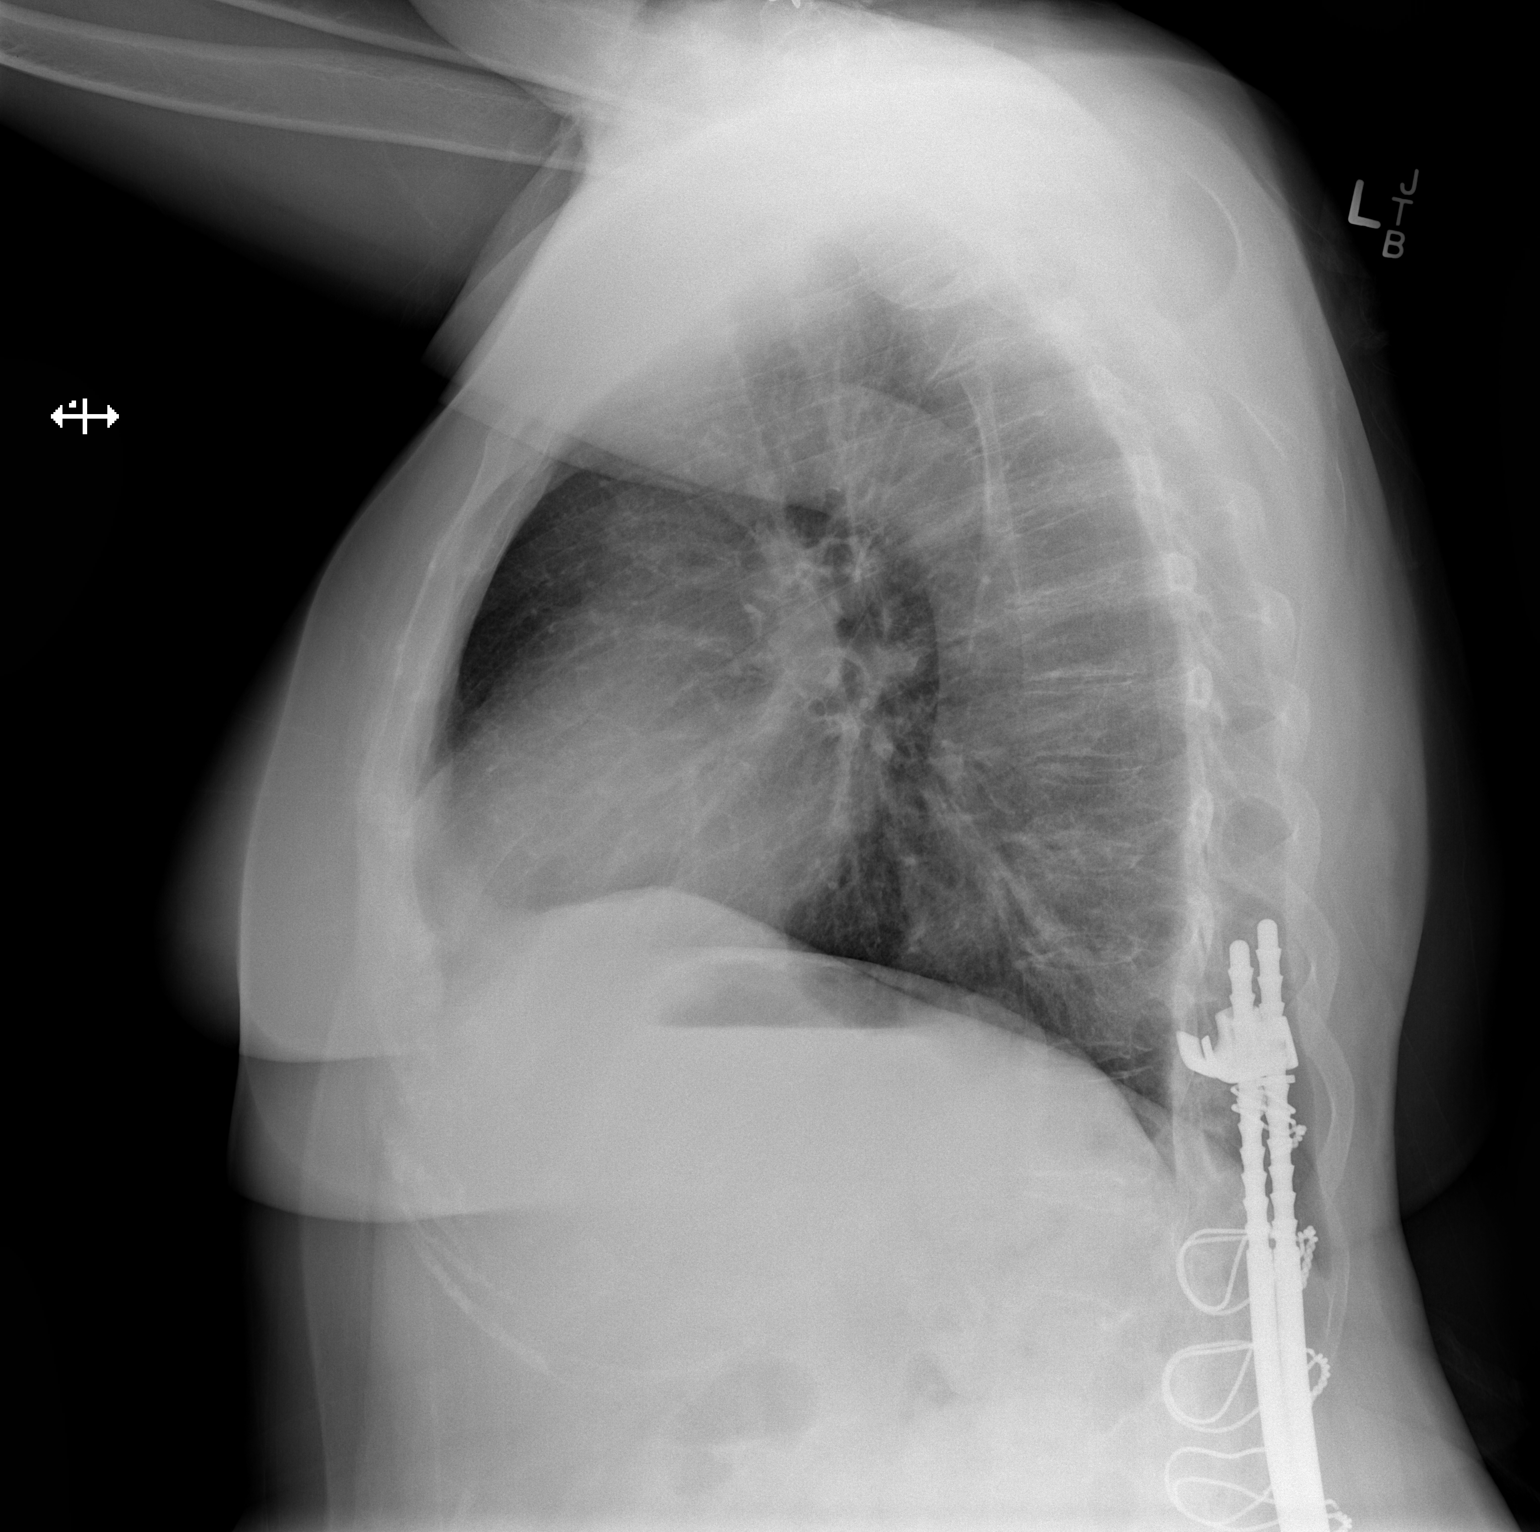

[2 of 2 positions shown; findings below may reference images not displayed]

FINDINGS: No active infiltrate or effusion is seen. Mediastinal and hilar
contours are unremarkable. The heart is within normal limits in
size. Harrington rods are noted within the lower thoracic and upper
lumbar spine.
IMPRESSION: No active cardiopulmonary disease.

## 2017-05-27 ENCOUNTER — Other Ambulatory Visit: Payer: Self-pay | Admitting: Family Medicine

## 2017-05-27 DIAGNOSIS — R921 Mammographic calcification found on diagnostic imaging of breast: Secondary | ICD-10-CM

## 2017-05-27 DIAGNOSIS — R922 Inconclusive mammogram: Secondary | ICD-10-CM

## 2017-05-27 DIAGNOSIS — R9230 Dense breasts, unspecified: Secondary | ICD-10-CM

## 2017-06-01 ENCOUNTER — Other Ambulatory Visit: Payer: Self-pay | Admitting: Nurse Practitioner

## 2017-06-01 DIAGNOSIS — R921 Mammographic calcification found on diagnostic imaging of breast: Secondary | ICD-10-CM

## 2017-07-15 ENCOUNTER — Ambulatory Visit
Admission: RE | Admit: 2017-07-15 | Discharge: 2017-07-15 | Disposition: A | Discharge status: Home / Self Care | Payer: BC Managed Care – PPO | Source: Ambulatory Visit | Attending: Family Medicine | Admitting: Family Medicine

## 2017-07-15 DIAGNOSIS — R921 Mammographic calcification found on diagnostic imaging of breast: Secondary | ICD-10-CM

## 2018-06-16 ENCOUNTER — Other Ambulatory Visit: Payer: Self-pay | Admitting: Family Medicine

## 2018-06-16 ENCOUNTER — Other Ambulatory Visit: Payer: Self-pay | Admitting: Nurse Practitioner

## 2018-06-16 DIAGNOSIS — R921 Mammographic calcification found on diagnostic imaging of breast: Secondary | ICD-10-CM

## 2018-07-21 ENCOUNTER — Ambulatory Visit
Admission: RE | Admit: 2018-07-21 | Discharge: 2018-07-21 | Disposition: A | Discharge status: Home / Self Care | Payer: BC Managed Care – PPO | Source: Ambulatory Visit | Attending: Family Medicine | Admitting: Family Medicine

## 2018-07-21 ENCOUNTER — Ambulatory Visit
Admission: RE | Admit: 2018-07-21 | Discharge: 2018-07-21 | Disposition: A | Discharge status: Home / Self Care | Payer: BC Managed Care – PPO | Source: Ambulatory Visit | Attending: Nurse Practitioner | Admitting: Nurse Practitioner

## 2018-07-21 ENCOUNTER — Other Ambulatory Visit: Payer: Self-pay | Admitting: Family Medicine

## 2018-07-21 DIAGNOSIS — N6310 Unspecified lump in the right breast, unspecified quadrant: Secondary | ICD-10-CM

## 2018-07-21 DIAGNOSIS — R921 Mammographic calcification found on diagnostic imaging of breast: Secondary | ICD-10-CM

## 2018-12-28 ENCOUNTER — Other Ambulatory Visit (HOSPITAL_COMMUNITY)
Admission: RE | Admit: 2018-12-28 | Discharge: 2018-12-28 | Disposition: A | Discharge status: Home / Self Care | Payer: BC Managed Care – PPO | Source: Ambulatory Visit | Attending: Family Medicine | Admitting: Family Medicine

## 2018-12-28 ENCOUNTER — Other Ambulatory Visit: Payer: Self-pay | Admitting: Family Medicine

## 2018-12-28 DIAGNOSIS — Z124 Encounter for screening for malignant neoplasm of cervix: Secondary | ICD-10-CM | POA: Insufficient documentation

## 2018-12-30 LAB — CYTOLOGY - PAP
Diagnosis: NEGATIVE
HPV: NOT DETECTED

## 2019-01-03 ENCOUNTER — Other Ambulatory Visit: Payer: Self-pay | Admitting: Family Medicine

## 2019-01-03 DIAGNOSIS — E782 Mixed hyperlipidemia: Secondary | ICD-10-CM

## 2019-01-11 ENCOUNTER — Ambulatory Visit
Admission: RE | Admit: 2019-01-11 | Discharge: 2019-01-11 | Disposition: A | Discharge status: Home / Self Care | Payer: BC Managed Care – PPO | Source: Ambulatory Visit | Attending: Family Medicine | Admitting: Family Medicine

## 2019-01-11 DIAGNOSIS — E782 Mixed hyperlipidemia: Secondary | ICD-10-CM

## 2019-01-12 ENCOUNTER — Other Ambulatory Visit: Payer: BC Managed Care – PPO

## 2019-02-28 ENCOUNTER — Emergency Department (HOSPITAL_COMMUNITY)
Admission: EM | Admit: 2019-02-28 | Discharge: 2019-02-28 | Disposition: A | Discharge status: Home / Self Care | Payer: BC Managed Care – PPO | Attending: Emergency Medicine | Admitting: Emergency Medicine

## 2019-02-28 ENCOUNTER — Other Ambulatory Visit: Payer: Self-pay

## 2019-02-28 ENCOUNTER — Encounter (HOSPITAL_COMMUNITY): Payer: Self-pay

## 2019-02-28 ENCOUNTER — Emergency Department (HOSPITAL_COMMUNITY): Payer: BC Managed Care – PPO

## 2019-02-28 DIAGNOSIS — R109 Unspecified abdominal pain: Secondary | ICD-10-CM

## 2019-02-28 DIAGNOSIS — R1033 Periumbilical pain: Secondary | ICD-10-CM | POA: Diagnosis not present

## 2019-02-28 DIAGNOSIS — K429 Umbilical hernia without obstruction or gangrene: Secondary | ICD-10-CM | POA: Diagnosis not present

## 2019-02-28 DIAGNOSIS — R7989 Other specified abnormal findings of blood chemistry: Secondary | ICD-10-CM | POA: Insufficient documentation

## 2019-02-28 DIAGNOSIS — Z79899 Other long term (current) drug therapy: Secondary | ICD-10-CM | POA: Insufficient documentation

## 2019-02-28 DIAGNOSIS — Z7982 Long term (current) use of aspirin: Secondary | ICD-10-CM | POA: Insufficient documentation

## 2019-02-28 LAB — URINALYSIS, ROUTINE W REFLEX MICROSCOPIC
Bilirubin Urine: NEGATIVE
Glucose, UA: NEGATIVE mg/dL
Hgb urine dipstick: NEGATIVE
Ketones, ur: NEGATIVE mg/dL
Nitrite: NEGATIVE
Protein, ur: NEGATIVE mg/dL
Specific Gravity, Urine: 1.01 (ref 1.005–1.030)
pH: 8 (ref 5.0–8.0)

## 2019-02-28 LAB — COMPREHENSIVE METABOLIC PANEL
ALT: 64 U/L — ABNORMAL HIGH (ref 0–44)
AST: 124 U/L — ABNORMAL HIGH (ref 15–41)
Albumin: 3.6 g/dL (ref 3.5–5.0)
Alkaline Phosphatase: 121 U/L (ref 38–126)
Anion gap: 11 (ref 5–15)
BUN: 20 mg/dL (ref 8–23)
CO2: 23 mmol/L (ref 22–32)
Calcium: 8.9 mg/dL (ref 8.9–10.3)
Chloride: 105 mmol/L (ref 98–111)
Creatinine, Ser: 0.62 mg/dL (ref 0.44–1.00)
GFR calc Af Amer: >60 mL/min (ref >60)
GFR calc non Af Amer: >60 mL/min (ref >60)
Glucose, Bld: 110 mg/dL — ABNORMAL HIGH (ref 70–99)
Potassium: 4 mmol/L (ref 3.5–5.1)
Sodium: 139 mmol/L (ref 135–145)
Total Bilirubin: 0.3 mg/dL (ref 0.3–1.2)
Total Protein: 6.7 g/dL (ref 6.5–8.1)

## 2019-02-28 LAB — CBC
HCT: 37.4 % (ref 36.0–46.0)
Hemoglobin: 11.9 g/dL — ABNORMAL LOW (ref 12.0–15.0)
MCH: 27.5 pg (ref 26.0–34.0)
MCHC: 31.8 g/dL (ref 30.0–36.0)
MCV: 86.6 fL (ref 80.0–100.0)
Platelets: 348 10*3/uL (ref 150–400)
RBC: 4.32 MIL/uL (ref 3.87–5.11)
RDW: 13.8 % (ref 11.5–15.5)
WBC: 13.2 10*3/uL — ABNORMAL HIGH (ref 4.0–10.5)
nRBC: 0 % (ref 0.0–0.2)

## 2019-02-28 LAB — LIPASE, BLOOD: Lipase: 27 U/L (ref 11–51)

## 2019-02-28 MED ORDER — IOHEXOL 300 MG/ML  SOLN
100.0000 mL | Freq: Once | INTRAMUSCULAR | Status: AC | PRN
  Administered 2019-02-28: 100 mL via INTRAVENOUS

## 2019-02-28 MED ORDER — SODIUM CHLORIDE (PF) 0.9 % IJ SOLN
INTRAMUSCULAR | Status: AC
  Filled 2019-02-28: qty 50

## 2019-02-28 MED ORDER — SODIUM CHLORIDE 0.9% FLUSH: 3.0000 mL | Freq: Once | INTRAVENOUS | Status: DC

## 2019-02-28 NOTE — ED Triage Notes (Signed)
States abdominal pain for about 30 minutes PTA no vomiting or nausea noted.

## 2019-02-28 NOTE — ED Provider Notes (Signed)
[facility]-EMERGENCY DEPT Provider Note   CSN: [id number] Arrival date & time: 02/28/19  1651    History   Chief Complaint Chief Complaint  Patient presents with  . Abdominal Pain    HPI [name] is a 63 y.o. female with a hx Parks Parks, [place] Parks, & GERD who arrives to the ED via EMS with complaints Parks abdominal pain @ 15:30 that is fairly resolved upon my evaluation. Patient states that she was at rest when she got a fairly sudden onset pain to the periumbilical area described as a burning ache. She states it persisted and was fairly severe to the point that she called 911. She states that since ER arrival the pain has seemed to ease off and now feels to have resolved. She states she passed some gas while in the ER which she believes helped. No other alleviating/aggravating factors. Denies nausea, vomiting, diarrhea, melena, hematochezia, constipation, dysuria, or chest pain. Last BM was earlier today.      HPI  Past Medical History:  Diagnosis Date  . Parks   . [place] Parks   . Arthritis   . Family history Parks anesthesia complication    sister has nausea    Patient Active Problem List   Diagnosis Date Noted  . Primary osteoarthritis Parks left knee 03/29/2016  . Arthritis Parks knee, right 04/08/2014  . 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 Arthritis Parks knee, degenerative 01/07/2013  . Climacteric 12/31/2010  . Acid reflux 11/08/2007    Past Surgical History:  Procedure Laterality Date  . BACK SURGERY    . BREAST EXCISIONAL BIOPSY Left   . CESAREAN SECTION  1990  . EYE SURGERY     cataract  . JOINT REPLACEMENT    . TOTAL KNEE ARTHROPLASTY Right 04/10/2014   dr Turner Danielsrowan  . TOTAL KNEE ARTHROPLASTY Right 04/10/2014   Procedure: RIGHT TOTAL KNEE ARTHOPLASTY;  Surgeon: Nestor LewandowskyFrank J Rowan, MD;  Location: [facility] OR;  Service: Orthopedics;  Laterality: Right;  . TOTAL KNEE ARTHROPLASTY Left 03/31/2016   Procedure: TOTAL KNEE ARTHROPLASTY;  Surgeon: Gean BirchwoodFrank Rowan, MD;   Location: [facility] OR;  Service: Orthopedics;  Laterality: Left;     OB History   No obstetric history on file.      Home Medications    Prior to Admission medications   Medication Sig Start Date End Date Taking? Authorizing Provider  aspirin EC 325 MG tablet Take 1 tablet (325 mg total) by mouth 2 (two) times daily. 03/31/16   Allena KatzPhillips, Eric K, PA-C  diclofenac sodium (VOLTAREN) 1 % GEL Apply 2 g topically every morning.    [provider]  Glucosamine HCl (GLUCOSAMINE PO) Take 1 tablet by mouth 2 (two) times daily.     [provider]  loratadine (CLARITIN) 10 MG tablet Take 10 mg by mouth at bedtime.    [provider]  methocarbamol (ROBAXIN) 500 MG tablet Take 1 tablet (500 mg total) by mouth 2 (two) times daily with a meal. 03/31/16   Allena KatzPhillips, Eric K, PA-C  Multiple Vitamin (MULTIVITAMIN WITH MINERALS) TABS tablet Take 1 tablet by mouth daily.    [provider]  oxyCODONE-acetaminophen (ROXICET) 5-325 MG tablet Take 1 tablet by mouth every 4 (four) hours as needed. 03/31/16   Allena KatzPhillips, Eric K, PA-C    Family History Family History  Problem Relation Age Parks Onset  . Breast cancer Sister   . Breast cancer Maternal Aunt     Social History Social History   Tobacco Use  .  Smoking status: Never Smoker  . Smokeless tobacco: Never Used  Substance Use Topics  . Alcohol use: No    Comment: quit   1995  . Drug use: No     Allergies   Patient has no known allergies.   Review Parks Systems Review Parks Systems  Constitutional: Negative for chills and fever.  Cardiovascular: Negative for chest pain.  Gastrointestinal: Positive for abdominal pain. Negative for anal bleeding, blood in stool, constipation, diarrhea, nausea and vomiting.  Genitourinary: Negative for dysuria.  All other systems reviewed and are negative.    Physical Exam Updated Vital Signs BP (!) 149/88 (BP Location: Right Arm)   Pulse 72   Temp 98.3 F (36.8 C) (Oral)   Resp 16    Ht 5\' 6"  (1.676 m)   Wt 77.1 kg   SpO2 98%   BMI 27.43 kg/m   Physical Exam Vitals signs and nursing note reviewed.  Constitutional:      General: She is not in acute distress.    Appearance: She is well-developed. She is not toxic-appearing.  HENT:     Head: Normocephalic and atraumatic.  Eyes:     General:        Right eye: No discharge.        Left eye: No discharge.     Conjunctiva/sclera: Conjunctivae normal.  Neck:     Musculoskeletal: Neck supple.  Cardiovascular:     Rate and Rhythm: Normal rate and regular rhythm.  Pulmonary:     Effort: Pulmonary effort is normal. No respiratory distress.     Breath sounds: Normal breath sounds. No wheezing, rhonchi or rales.  Abdominal:     General: Bowel sounds are normal. There is no distension.     Palpations: Abdomen is soft.     Tenderness: There is no abdominal tenderness. There is no guarding or rebound.  Skin:    General: Skin is warm and dry.     Findings: No rash.  Neurological:     Mental Status: She is alert.     Comments: Clear speech.   Psychiatric:        Behavior: Behavior normal.    ED Treatments / Results  Labs (all labs ordered are listed, but only abnormal results are displayed) Labs Reviewed  COMPREHENSIVE METABOLIC PANEL - Abnormal; Notable for the following components:      Result Value   Glucose, Bld 110 (*)    AST 124 (*)    ALT 64 (*)    All other components within normal limits  CBC - Abnormal; Notable for the following components:   WBC 13.2 (*)    Hemoglobin 11.9 (*)    All other components within normal limits  URINALYSIS, ROUTINE W REFLEX MICROSCOPIC - Abnormal; Notable for the following components:   APPearance HAZY (*)    Leukocytes,Ua LARGE (*)    Bacteria, UA RARE (*)    Non Squamous Epithelial 0-5 (*)    All other components within normal limits  LIPASE, BLOOD    EKG None  Radiology Ct Abdomen Pelvis W Contrast  Result Date: 02/28/2019 CLINICAL DATA:  Abdominal pain.  EXAM: CT ABDOMEN AND PELVIS WITH CONTRAST TECHNIQUE: Multidetector CT imaging Parks the abdomen and pelvis was performed using the standard protocol following bolus administration Parks intravenous contrast. CONTRAST:  OMNIPAQUE IOHEXOL 300 MG/ML  SOLN COMPARISON:  None FINDINGS: Lower chest: No acute abnormality. Hepatobiliary: No focal liver abnormality identified. Gallbladder normal. No biliary dilatation. Pancreas: Unremarkable. No pancreatic ductal  dilatation or surrounding inflammatory changes. Spleen: Normal in size without focal abnormality. Adrenals/Urinary Tract: The adrenal glands are normal. No kidney mass or hydronephrosis. The urinary bladder is unremarkable. Stomach/Bowel: Stomach is normal. The small bowel loops have a normal course and caliber. No obstruction. The appendix is not confidently identified separate from the right lower quadrant bowel loops. No secondary signs Parks acute appendicitis however. The colon is unremarkable. Vascular/Lymphatic: Aortic atherosclerosis without aneurysm. No abdominopelvic adenopathy identified. Reproductive: Retroverted uterus identified.  No adnexal mass. Other: No free fluid or fluid collections. Periumbilical hernia contains fat only. Musculoskeletal: Postop change from posterior hardware fixation identified. IMPRESSION: 1. No acute findings within the abdomen or pelvis. No explanation for patient's abdominal pain. 2.  Aortic Atherosclerosis (ICD10-I70.0). 3. Periumbilical abdominal wall hernia contains fat only. Electronically Signed   By: Signa Kell M.D.   On: 02/28/2019 20:13    Procedures Procedures (including critical care time)  Medications Ordered in ED Medications  sodium chloride flush (NS) 0.9 % injection 3 mL (has no administration in time range)     Initial Impression / Assessment and Plan / ED Course  I have reviewed the triage vital signs and the nursing notes.  Pertinent labs & imaging results that were available during my care  Parks the patient were reviewed by me and considered in my medical decision making (see chart for details).    Patient presents to the ED with complaints Parks abdominal pain that had fairly sudden onset, now resolved after passing gas in the ER waiting room. Patient nontoxic appearing, in no apparent distress, vitals WNL other than elevated BP- doubt HTN emergency, PCP recheck. On exam patient's abdomen is nontender, no peritoneal signs.  Labs per triage reviewed:  CBC: Leukocytosis @ 13.2, Parks improved from prior CMP: LFTs are elevated: AST 124, ALT 64- patient denies EtOH use, no excess tylenol- unclear etiology.  Lipase: WNL UA: Leuks present, nitrite negative, no urinary sxs, will culture.   Discussed w/ supervising physician Dr. Rodena Medin- recommends CT abdomen/pelvis- I am in agreement.   CT:  No acute findings within the abdomen or pelvis. No explanation for patient's abdominal pain. Aortic Atherosclerosis. Periumbilical abdominal wall hernia contains fat only.   Unclear definitve etiology, but given patient's pain was in the periumbilical area query relation to abdominal wall hernia, no signs Parks incarceration/strangulation/obstruction on imaging or exam. Repeat exam remains nontender no peritonea signs. Do not suspect cholecystitis, pancreatitis, diverticulitis, appendicitis, or bowel obstruction/perforation. Patient tolerating PO in the emergency department, pain free. Will discharge home with PCP follow up for evaluation Parks sxs as well as for further evaluation Parks elevated LFTs as needed. Will also provide general surgery information regarding her hernia. I discussed results, treatment plan, need for PCP follow-up, and return precautions with the patient. Provided opportunity for questions, patient confirmed understanding and is in agreement with plan.   Findings and plan Parks care discussed with supervising physician Dr. Rodena Medin who is in agreement.   Final Clinical Impressions(s) / ED  Diagnoses   Final diagnoses:  Abdominal pain, unspecified abdominal location  Elevated LFTs  Periumbilical hernia    ED Discharge Orders    None       Desmond Lope 02/28/19 2034    Wynetta Fines, MD 02/28/19 2233

## 2019-02-28 NOTE — ED Notes (Signed)
Urine culture sen to lab with UA sample

## 2019-02-28 NOTE — Discharge Instructions (Signed)
You were seen in the emergency department today for abdominal pain. Your blood work showed that your white blood cell count is somewhat elevated, your liver function tests are also somewhat elevated, and your urine did show some white blood cells in it.  Each of these findings should be followed up by your primary care provider in the next 3 to 5 days possibly for repeat blood work.  Your CT scan showed that you have a periumbilical hernia which may have been related to your discomfort today. IT also showed some plaque in the artery in your stomach.   Follow up with your primary care provider within 3 days for re-evaluation. WE have also provided general surgery information to follow up for the hernia. Return to the ER for new or worsening symptoms including but not limited to return of abdominal pain, inability to keep fluids down, inability to have a bowel movement, fever, chest pain, or any other concerns.   Your blood pressure was also high at the ER . Please have this rechecked by primary care as well.

## 2019-03-02 LAB — URINE CULTURE: Culture: 50000 — AB

## 2019-03-03 ENCOUNTER — Telehealth: Payer: Self-pay

## 2019-03-03 NOTE — Telephone Encounter (Signed)
Post ED Visit - Positive Culture Follow-up  Culture report reviewed by antimicrobial stewardship pharmacist: [facility] []  Enzo Bi, Pharm.D. []  Celedonio Miyamoto, Pharm.D., BCPS AQ-ID []  Garvin Fila, Pharm.D., BCPS []  Georgina Pillion, [address].D., BCPS []  [place], [address].D., BCPS, AAHIVP []  Estella Husk, Pharm.D., BCPS, AAHIVP []  Lysle Pearl, PharmD, BCPS []  Phillips Climes, PharmD, BCPS []  Agapito Games, PharmD, BCPS []  Verlan Friends, PharmD []  Mervyn Gay, PharmD, BCPS []  Vinnie Level, PharmD  [facility] []  Len Childs, PharmD []  Greer Pickerel, PharmD []  Adalberto Cole, PharmD []  Perlie Gold, Rph []  Lonell Face) Jean Rosenthal, PharmD []  Earl Many, PharmD []  Junita Push, PharmD [x]  Dorna Leitz, PharmD []  Terrilee Files, PharmD []  Lynann Beaver, PharmD []  Keturah Barre, PharmD []  Loralee Pacas, PharmD []  Bernadene Person, PharmD  urine culture  and no further patient follow-up is required at this time.  Jerry Caras 03/03/2019, 8:56 AM

## 2019-05-27 ENCOUNTER — Other Ambulatory Visit: Payer: Self-pay | Admitting: Family Medicine

## 2019-05-27 DIAGNOSIS — E782 Mixed hyperlipidemia: Secondary | ICD-10-CM

## 2019-06-07 ENCOUNTER — Ambulatory Visit
Admission: RE | Admit: 2019-06-07 | Discharge: 2019-06-07 | Disposition: A | Discharge status: Home / Self Care | Payer: Self-pay | Source: Ambulatory Visit | Attending: Family Medicine | Admitting: Family Medicine

## 2019-06-07 DIAGNOSIS — E782 Mixed hyperlipidemia: Secondary | ICD-10-CM

## 2019-07-22 ENCOUNTER — Other Ambulatory Visit: Payer: Self-pay | Admitting: Family Medicine

## 2019-07-22 DIAGNOSIS — Z1231 Encounter for screening mammogram for malignant neoplasm of breast: Secondary | ICD-10-CM

## 2019-09-06 ENCOUNTER — Ambulatory Visit
Admission: RE | Admit: 2019-09-06 | Discharge: 2019-09-06 | Disposition: A | Discharge status: Home / Self Care | Payer: BC Managed Care – PPO | Source: Ambulatory Visit | Attending: Family Medicine | Admitting: Family Medicine

## 2019-09-06 ENCOUNTER — Other Ambulatory Visit: Payer: Self-pay

## 2019-09-06 DIAGNOSIS — Z1231 Encounter for screening mammogram for malignant neoplasm of breast: Secondary | ICD-10-CM

## 2020-01-09 ENCOUNTER — Other Ambulatory Visit: Payer: Self-pay | Admitting: Family Medicine

## 2020-01-09 DIAGNOSIS — Z780 Asymptomatic menopausal state: Secondary | ICD-10-CM

## 2020-01-09 DIAGNOSIS — E2839 Other primary ovarian failure: Secondary | ICD-10-CM

## 2020-01-09 DIAGNOSIS — Z1231 Encounter for screening mammogram for malignant neoplasm of breast: Secondary | ICD-10-CM

## 2020-04-08 IMAGING — CT CT ABDOMEN AND PELVIS WITH CONTRAST
2 of 5 series · 17 of 46 positions shown, 19 images · IV contrast (ISOVUE)
Comparison: None

CLINICAL DATA: Abdominal pain.

EXAM:
CT ABDOMEN AND PELVIS WITH CONTRAST
TECHNIQUE: Multidetector CT imaging of the abdomen and pelvis was performed
using the standard protocol following bolus administration of
intravenous contrast.
CONTRAST:  100mL OMNIPAQUE IOHEXOL 300 MG/ML  SOLN

[Series 2: axial st · axial · 0.75mm/px · z∈[-454,-104]mm · 14 of 82 slices shown, 16 images]
[im 6/82  soft-tissue]
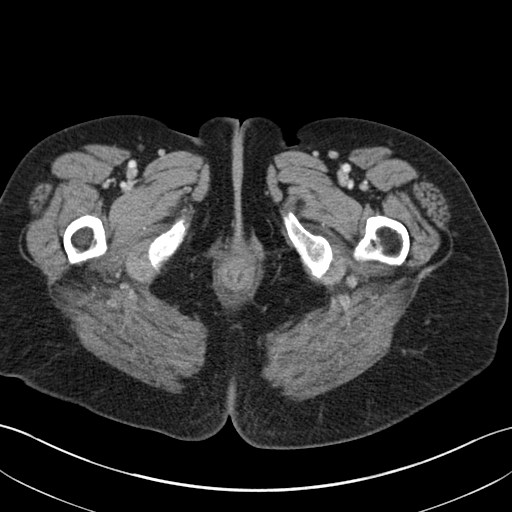
[im 6/82  bone]
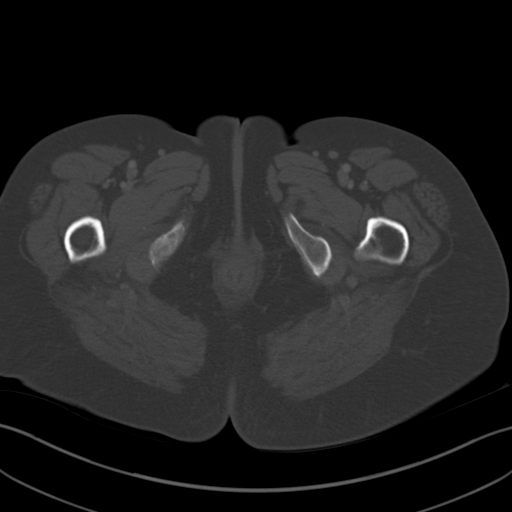
[im 11/82  soft-tissue]
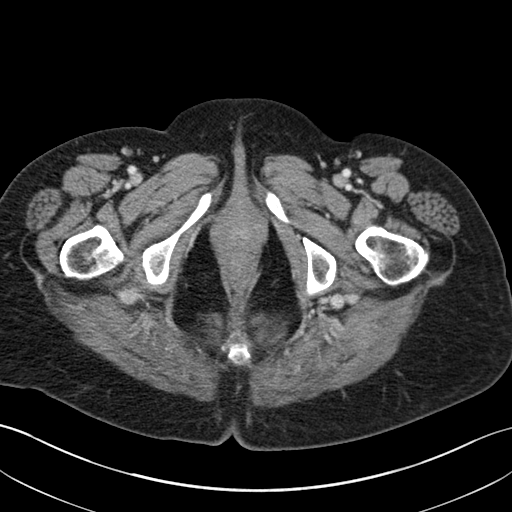
[im 17/82  soft-tissue]
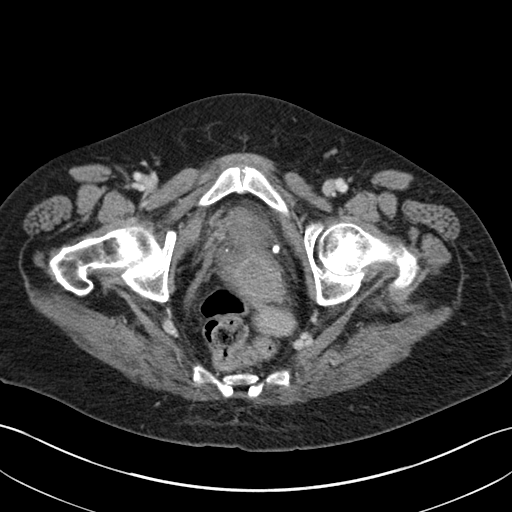
[im 22/82  soft-tissue]
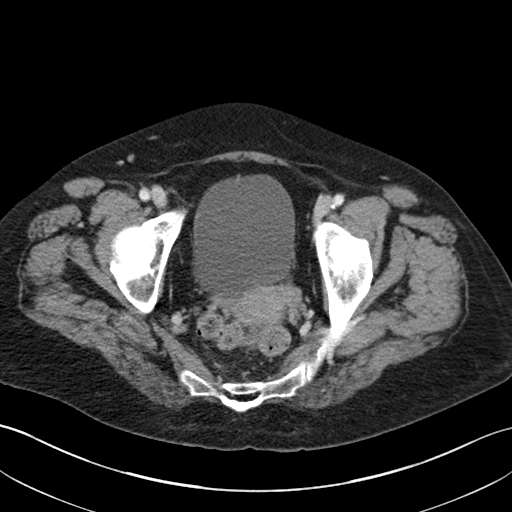
[im 28/82  soft-tissue]
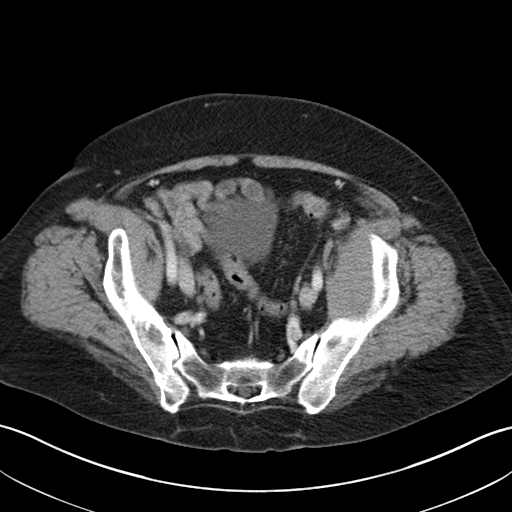
[im 33/82  soft-tissue]
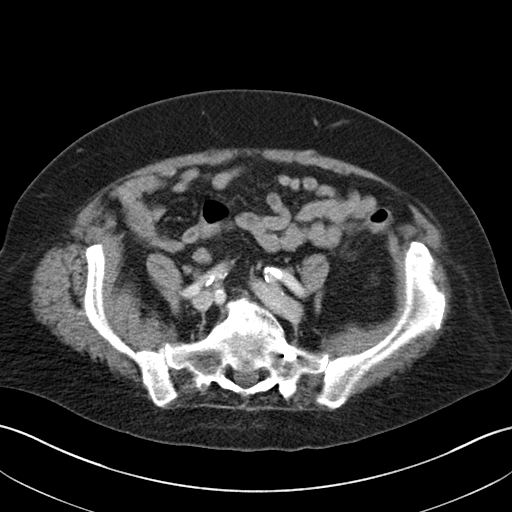
[im 38/82  soft-tissue]
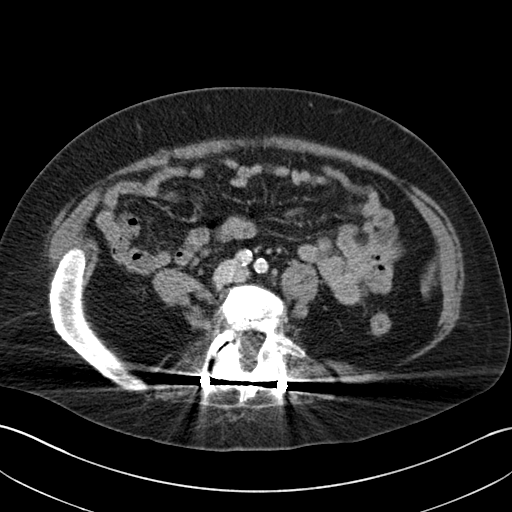
[im 44/82  soft-tissue]
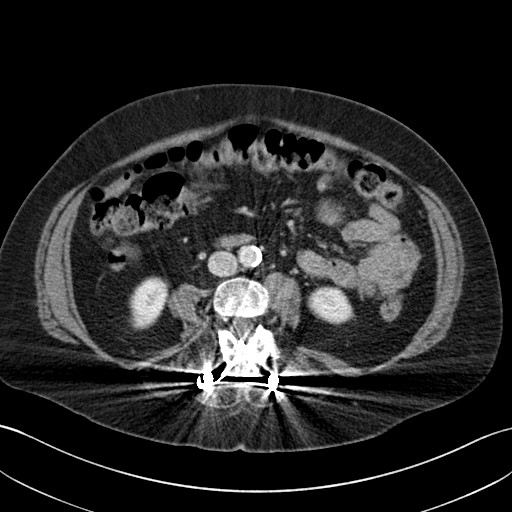
[im 49/82  soft-tissue]
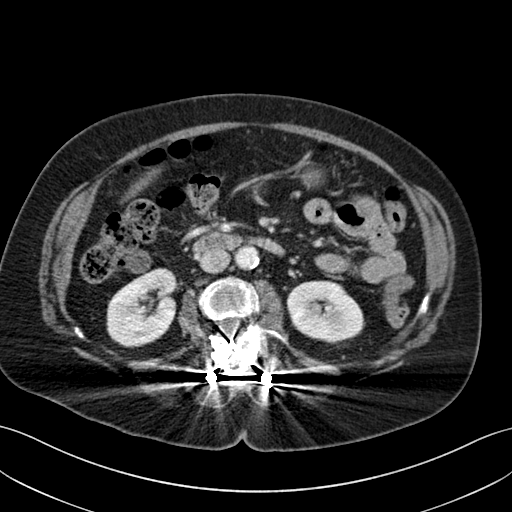
[im 49/82  bone]
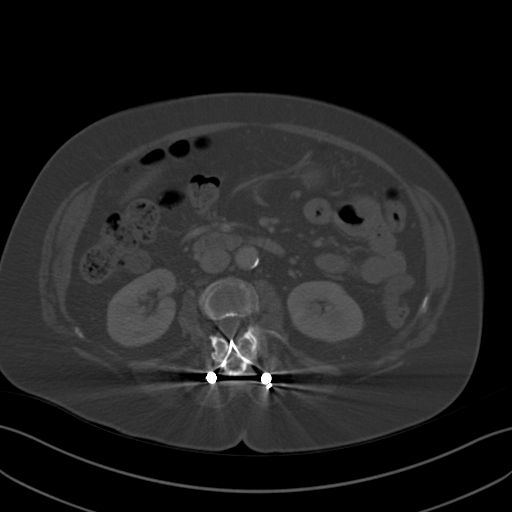
[im 55/82  soft-tissue]
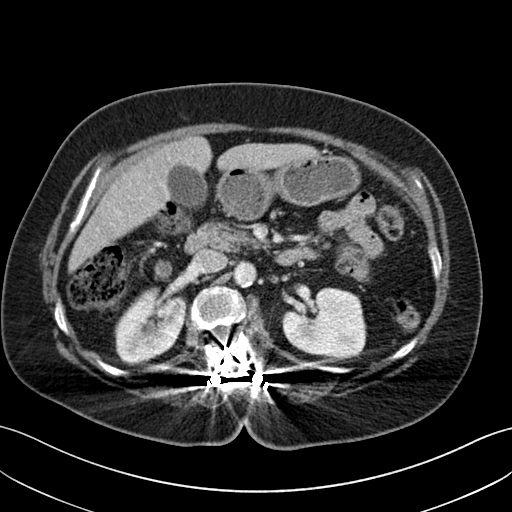
[im 60/82  soft-tissue]
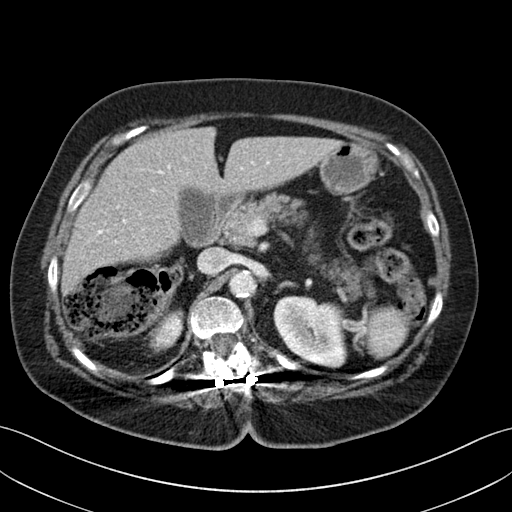
[im 65/82  soft-tissue]
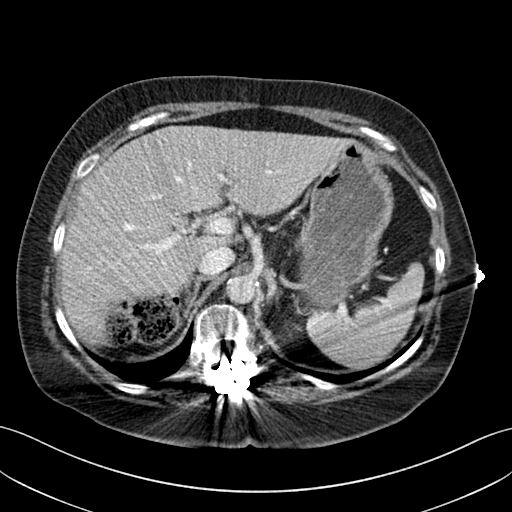
[im 71/82  soft-tissue]
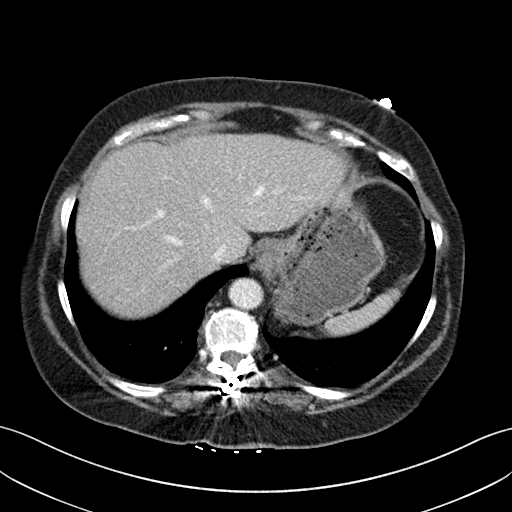
[im 76/82  soft-tissue]
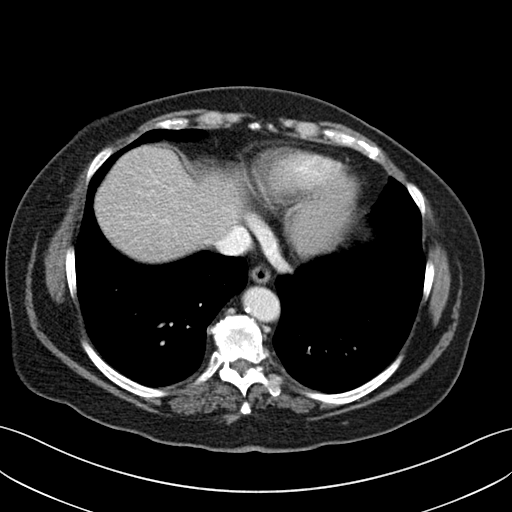

[Series 5: coronal st · coronal · 0.74mm/px · 3 of 134 slices shown]
[im 45/134  soft-tissue]
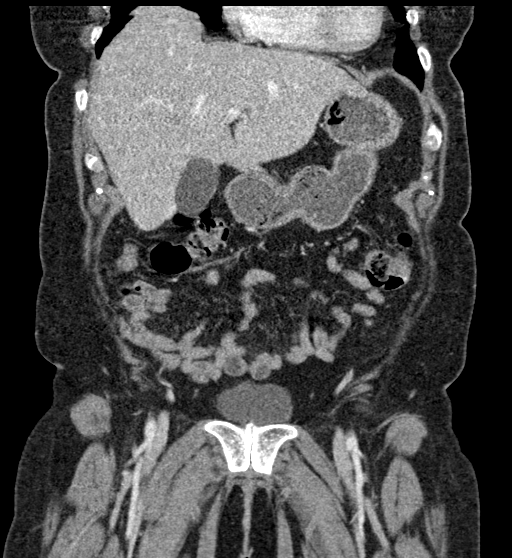
[im 60/134  soft-tissue]
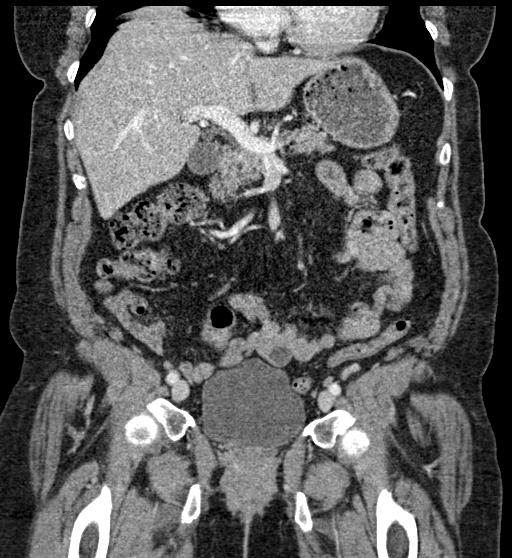
[im 74/134  soft-tissue]
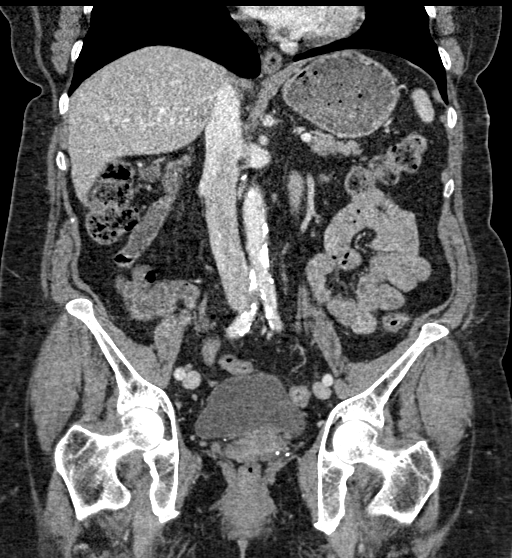

[17 of 46 positions shown; findings below may reference images not displayed]

FINDINGS: Lower chest: No acute abnormality.

Hepatobiliary: No focal liver abnormality identified. Gallbladder
normal. No biliary dilatation.

Pancreas: Unremarkable. No pancreatic ductal dilatation or
surrounding inflammatory changes.

Spleen: Normal in size without focal abnormality.

Adrenals/Urinary Tract: The adrenal glands are normal. No kidney
mass or hydronephrosis. The urinary bladder is unremarkable.

Stomach/Bowel: Stomach is normal. The small bowel loops have a
normal course and caliber. No obstruction. The appendix is not
confidently identified separate from the right lower quadrant bowel
loops. No secondary signs of acute appendicitis however. The colon
is unremarkable.

Vascular/Lymphatic: Aortic atherosclerosis without aneurysm. No
abdominopelvic adenopathy identified.

Reproductive: Retroverted uterus identified.  No adnexal mass.

Other: No free fluid or fluid collections. Periumbilical hernia
contains fat only.

Musculoskeletal: Postop change from posterior hardware fixation
identified.
IMPRESSION: 1. No acute findings within the abdomen or pelvis. No explanation
for patient's abdominal pain.
2.  Aortic Atherosclerosis ([id number]).
3. Periumbilical abdominal wall hernia contains fat only.

## 2020-09-07 ENCOUNTER — Other Ambulatory Visit: Payer: BC Managed Care – PPO

## 2020-09-07 ENCOUNTER — Ambulatory Visit: Payer: BC Managed Care – PPO

## 2020-12-17 ENCOUNTER — Other Ambulatory Visit: Payer: Self-pay | Admitting: Family Medicine

## 2020-12-17 DIAGNOSIS — Z780 Asymptomatic menopausal state: Secondary | ICD-10-CM

## 2020-12-20 ENCOUNTER — Other Ambulatory Visit: Payer: BC Managed Care – PPO

## 2020-12-20 ENCOUNTER — Ambulatory Visit: Payer: BC Managed Care – PPO

## 2020-12-21 ENCOUNTER — Other Ambulatory Visit: Payer: Self-pay | Admitting: Family Medicine

## 2020-12-21 DIAGNOSIS — Z780 Asymptomatic menopausal state: Secondary | ICD-10-CM

## 2021-02-07 ENCOUNTER — Ambulatory Visit
Admission: RE | Admit: 2021-02-07 | Discharge: 2021-02-07 | Disposition: A | Discharge status: Home / Self Care | Payer: Self-pay | Source: Ambulatory Visit | Attending: Family Medicine | Admitting: Family Medicine

## 2021-02-07 ENCOUNTER — Other Ambulatory Visit: Payer: Self-pay

## 2021-02-07 DIAGNOSIS — Z1231 Encounter for screening mammogram for malignant neoplasm of breast: Secondary | ICD-10-CM

## 2021-05-14 ENCOUNTER — Other Ambulatory Visit: Payer: Self-pay

## 2021-05-14 ENCOUNTER — Ambulatory Visit
Admission: RE | Admit: 2021-05-14 | Discharge: 2021-05-14 | Disposition: A | Discharge status: Home / Self Care | Payer: Medicare Other | Source: Ambulatory Visit | Attending: Family Medicine | Admitting: Family Medicine

## 2021-05-14 DIAGNOSIS — Z780 Asymptomatic menopausal state: Secondary | ICD-10-CM

## 2022-01-08 ENCOUNTER — Other Ambulatory Visit: Payer: Self-pay | Admitting: Family Medicine

## 2022-01-08 DIAGNOSIS — Z1231 Encounter for screening mammogram for malignant neoplasm of breast: Secondary | ICD-10-CM

## 2022-02-10 ENCOUNTER — Ambulatory Visit
Admission: RE | Admit: 2022-02-10 | Discharge: 2022-02-10 | Disposition: A | Discharge status: Home / Self Care | Payer: Medicare PPO | Source: Ambulatory Visit | Attending: Family Medicine | Admitting: Family Medicine

## 2022-02-10 DIAGNOSIS — Z1231 Encounter for screening mammogram for malignant neoplasm of breast: Secondary | ICD-10-CM

## 2022-09-16 DIAGNOSIS — L821 Other seborrheic keratosis: Secondary | ICD-10-CM | POA: Diagnosis not present

## 2022-09-16 DIAGNOSIS — D2221 Melanocytic nevi of right ear and external auricular canal: Secondary | ICD-10-CM | POA: Diagnosis not present

## 2022-09-16 DIAGNOSIS — D2272 Melanocytic nevi of left lower limb, including hip: Secondary | ICD-10-CM | POA: Diagnosis not present

## 2022-09-16 DIAGNOSIS — D235 Other benign neoplasm of skin of trunk: Secondary | ICD-10-CM | POA: Diagnosis not present

## 2022-09-16 DIAGNOSIS — D485 Neoplasm of uncertain behavior of skin: Secondary | ICD-10-CM | POA: Diagnosis not present

## 2022-09-16 DIAGNOSIS — D225 Melanocytic nevi of trunk: Secondary | ICD-10-CM | POA: Diagnosis not present

## 2022-09-16 DIAGNOSIS — L309 Dermatitis, unspecified: Secondary | ICD-10-CM | POA: Diagnosis not present

## 2022-09-16 DIAGNOSIS — L814 Other melanin hyperpigmentation: Secondary | ICD-10-CM | POA: Diagnosis not present

## 2022-09-16 DIAGNOSIS — L578 Other skin changes due to chronic exposure to nonionizing radiation: Secondary | ICD-10-CM | POA: Diagnosis not present

## 2023-01-06 DIAGNOSIS — H6121 Impacted cerumen, right ear: Secondary | ICD-10-CM | POA: Diagnosis not present

## 2023-01-09 DIAGNOSIS — H6121 Impacted cerumen, right ear: Secondary | ICD-10-CM | POA: Diagnosis not present

## 2023-01-14 DIAGNOSIS — H6123 Impacted cerumen, bilateral: Secondary | ICD-10-CM | POA: Diagnosis not present

## 2023-01-14 DIAGNOSIS — H903 Sensorineural hearing loss, bilateral: Secondary | ICD-10-CM | POA: Diagnosis not present

## 2023-01-21 ENCOUNTER — Other Ambulatory Visit: Payer: Self-pay | Admitting: Family Medicine

## 2023-01-21 DIAGNOSIS — Z1231 Encounter for screening mammogram for malignant neoplasm of breast: Secondary | ICD-10-CM

## 2023-02-05 DIAGNOSIS — E669 Obesity, unspecified: Secondary | ICD-10-CM | POA: Diagnosis not present

## 2023-02-05 DIAGNOSIS — Z0000 Encounter for general adult medical examination without abnormal findings: Secondary | ICD-10-CM | POA: Diagnosis not present

## 2023-02-05 DIAGNOSIS — Z1389 Encounter for screening for other disorder: Secondary | ICD-10-CM | POA: Diagnosis not present

## 2023-02-11 DIAGNOSIS — I700 Atherosclerosis of aorta: Secondary | ICD-10-CM | POA: Diagnosis not present

## 2023-02-11 DIAGNOSIS — F1021 Alcohol dependence, in remission: Secondary | ICD-10-CM | POA: Diagnosis not present

## 2023-02-11 DIAGNOSIS — M810 Age-related osteoporosis without current pathological fracture: Secondary | ICD-10-CM | POA: Diagnosis not present

## 2023-02-11 DIAGNOSIS — Z79899 Other long term (current) drug therapy: Secondary | ICD-10-CM | POA: Diagnosis not present

## 2023-02-11 DIAGNOSIS — Z6830 Body mass index (BMI) 30.0-30.9, adult: Secondary | ICD-10-CM | POA: Diagnosis not present

## 2023-02-23 ENCOUNTER — Ambulatory Visit
Admission: RE | Admit: 2023-02-23 | Discharge: 2023-02-23 | Disposition: A | Discharge status: Home / Self Care | Payer: Medicare PPO | Source: Ambulatory Visit | Attending: Family Medicine | Admitting: Family Medicine

## 2023-02-23 DIAGNOSIS — Z1231 Encounter for screening mammogram for malignant neoplasm of breast: Secondary | ICD-10-CM | POA: Diagnosis not present

## 2023-02-25 ENCOUNTER — Other Ambulatory Visit: Payer: Self-pay | Admitting: Family Medicine

## 2023-02-25 DIAGNOSIS — R928 Other abnormal and inconclusive findings on diagnostic imaging of breast: Secondary | ICD-10-CM

## 2023-03-10 ENCOUNTER — Ambulatory Visit
Admission: RE | Admit: 2023-03-10 | Discharge: 2023-03-10 | Disposition: A | Discharge status: Home / Self Care | Payer: Medicare PPO | Source: Ambulatory Visit | Attending: Family Medicine | Admitting: Family Medicine

## 2023-03-10 DIAGNOSIS — R922 Inconclusive mammogram: Secondary | ICD-10-CM | POA: Diagnosis not present

## 2023-03-10 DIAGNOSIS — R928 Other abnormal and inconclusive findings on diagnostic imaging of breast: Secondary | ICD-10-CM

## 2023-03-10 DIAGNOSIS — R92322 Mammographic fibroglandular density, left breast: Secondary | ICD-10-CM | POA: Diagnosis not present

## 2023-03-10 DIAGNOSIS — N6002 Solitary cyst of left breast: Secondary | ICD-10-CM | POA: Diagnosis not present

## 2023-04-06 DIAGNOSIS — M75121 Complete rotator cuff tear or rupture of right shoulder, not specified as traumatic: Secondary | ICD-10-CM | POA: Diagnosis not present

## 2023-04-13 DIAGNOSIS — M19011 Primary osteoarthritis, right shoulder: Secondary | ICD-10-CM | POA: Diagnosis not present

## 2023-04-13 DIAGNOSIS — M25611 Stiffness of right shoulder, not elsewhere classified: Secondary | ICD-10-CM | POA: Diagnosis not present

## 2023-04-15 DIAGNOSIS — M19011 Primary osteoarthritis, right shoulder: Secondary | ICD-10-CM | POA: Diagnosis not present

## 2023-04-15 DIAGNOSIS — M25611 Stiffness of right shoulder, not elsewhere classified: Secondary | ICD-10-CM | POA: Diagnosis not present

## 2023-04-20 DIAGNOSIS — M25611 Stiffness of right shoulder, not elsewhere classified: Secondary | ICD-10-CM | POA: Diagnosis not present

## 2023-04-20 DIAGNOSIS — M19011 Primary osteoarthritis, right shoulder: Secondary | ICD-10-CM | POA: Diagnosis not present

## 2023-04-22 DIAGNOSIS — M19011 Primary osteoarthritis, right shoulder: Secondary | ICD-10-CM | POA: Diagnosis not present

## 2023-04-22 DIAGNOSIS — M25611 Stiffness of right shoulder, not elsewhere classified: Secondary | ICD-10-CM | POA: Diagnosis not present

## 2023-04-28 DIAGNOSIS — M25611 Stiffness of right shoulder, not elsewhere classified: Secondary | ICD-10-CM | POA: Diagnosis not present

## 2023-04-28 DIAGNOSIS — M19011 Primary osteoarthritis, right shoulder: Secondary | ICD-10-CM | POA: Diagnosis not present

## 2023-05-18 DIAGNOSIS — M19011 Primary osteoarthritis, right shoulder: Secondary | ICD-10-CM | POA: Diagnosis not present

## 2023-05-21 DIAGNOSIS — M25611 Stiffness of right shoulder, not elsewhere classified: Secondary | ICD-10-CM | POA: Diagnosis not present

## 2023-05-21 DIAGNOSIS — M19011 Primary osteoarthritis, right shoulder: Secondary | ICD-10-CM | POA: Diagnosis not present

## 2023-09-30 DIAGNOSIS — L814 Other melanin hyperpigmentation: Secondary | ICD-10-CM | POA: Diagnosis not present

## 2023-09-30 DIAGNOSIS — L821 Other seborrheic keratosis: Secondary | ICD-10-CM | POA: Diagnosis not present

## 2023-09-30 DIAGNOSIS — D2230 Melanocytic nevi of unspecified part of face: Secondary | ICD-10-CM | POA: Diagnosis not present

## 2023-09-30 DIAGNOSIS — D2221 Melanocytic nevi of right ear and external auricular canal: Secondary | ICD-10-CM | POA: Diagnosis not present

## 2023-09-30 DIAGNOSIS — L578 Other skin changes due to chronic exposure to nonionizing radiation: Secondary | ICD-10-CM | POA: Diagnosis not present

## 2023-09-30 DIAGNOSIS — D225 Melanocytic nevi of trunk: Secondary | ICD-10-CM | POA: Diagnosis not present

## 2023-09-30 DIAGNOSIS — D2272 Melanocytic nevi of left lower limb, including hip: Secondary | ICD-10-CM | POA: Diagnosis not present

## 2023-09-30 DIAGNOSIS — D235 Other benign neoplasm of skin of trunk: Secondary | ICD-10-CM | POA: Diagnosis not present

## 2024-01-11 ENCOUNTER — Other Ambulatory Visit: Payer: Self-pay | Admitting: Family Medicine

## 2024-01-11 DIAGNOSIS — Z1231 Encounter for screening mammogram for malignant neoplasm of breast: Secondary | ICD-10-CM

## 2024-01-15 DIAGNOSIS — H18413 Arcus senilis, bilateral: Secondary | ICD-10-CM | POA: Diagnosis not present

## 2024-01-15 DIAGNOSIS — Z961 Presence of intraocular lens: Secondary | ICD-10-CM | POA: Diagnosis not present

## 2024-01-15 DIAGNOSIS — H25041 Posterior subcapsular polar age-related cataract, right eye: Secondary | ICD-10-CM | POA: Diagnosis not present

## 2024-01-15 DIAGNOSIS — H2511 Age-related nuclear cataract, right eye: Secondary | ICD-10-CM | POA: Diagnosis not present

## 2024-01-18 DIAGNOSIS — Z961 Presence of intraocular lens: Secondary | ICD-10-CM | POA: Diagnosis not present

## 2024-01-18 DIAGNOSIS — H18413 Arcus senilis, bilateral: Secondary | ICD-10-CM | POA: Diagnosis not present

## 2024-01-18 DIAGNOSIS — H25041 Posterior subcapsular polar age-related cataract, right eye: Secondary | ICD-10-CM | POA: Diagnosis not present

## 2024-01-18 DIAGNOSIS — H2511 Age-related nuclear cataract, right eye: Secondary | ICD-10-CM | POA: Diagnosis not present

## 2024-01-28 ENCOUNTER — Ambulatory Visit (INDEPENDENT_AMBULATORY_CARE_PROVIDER_SITE_OTHER): Payer: Medicare PPO | Admitting: Otolaryngology

## 2024-01-28 ENCOUNTER — Encounter (INDEPENDENT_AMBULATORY_CARE_PROVIDER_SITE_OTHER): Payer: Self-pay

## 2024-01-28 VITALS — BP 156/72 | HR 74 | Ht 63.0 in | Wt 167.0 lb

## 2024-01-28 DIAGNOSIS — H903 Sensorineural hearing loss, bilateral: Secondary | ICD-10-CM

## 2024-01-28 DIAGNOSIS — H6123 Impacted cerumen, bilateral: Secondary | ICD-10-CM | POA: Diagnosis not present

## 2024-01-28 DIAGNOSIS — H608X3 Other otitis externa, bilateral: Secondary | ICD-10-CM

## 2024-01-28 MED ORDER — MOMETASONE FUROATE 0.1 % EX CREA: TOPICAL_CREAM | CUTANEOUS | 1 refills | Status: AC

## 2024-01-28 NOTE — Progress Notes (Signed)
 Patient ID: Anne Parks, female   DOB: February 14, 1956, 68 y.o.   MRN: [id number]  Follow-up: Progressive hearing loss, recurrent cerumen impaction  HPI: The patient is a 68 year old female who returns today for follow-up evaluation.  The patient was last seen 1 year ago.  At that time, she was complaining of bilateral progressive hearing loss.  She was noted to have bilateral cerumen impaction and bilateral high-frequency sensorineural hearing loss, likely secondary to presbycusis.  The patient returns today reporting no significant change in her hearing.  She has noted intermittent itchy sensation in her ears.  Currently she denies any otalgia, otorrhea, or vertigo.  Exam: General: Communicates without difficulty, well nourished, no acute distress. Head: Normocephalic, no evidence injury, no tenderness, facial buttresses intact without stepoff. Face/sinus: No tenderness to palpation and percussion. Facial movement is normal and symmetric. Eyes: PERRL, EOMI. No scleral icterus, conjunctivae clear. Neuro: CN II exam reveals vision grossly intact.  No nystagmus at any point of gaze. Ears: Auricles well formed without lesions.  Bilateral cerumen impaction.  Nose: External evaluation reveals normal support and skin without lesions.  Dorsum is intact.  Anterior rhinoscopy reveals congested mucosa over anterior aspect of inferior turbinates and intact septum.  No purulence noted. Oral:  Oral cavity and oropharynx are intact, symmetric, without erythema or edema.  Mucosa is moist without lesions. Neck: Full range of motion without pain.  There is no significant lymphadenopathy.  No masses palpable.  Thyroid bed within normal limits to palpation.  Parotid glands and submandibular glands equal bilaterally without mass.  Trachea is midline. Neuro:  CN 2-12 grossly intact.   Procedure: Bilateral cerumen disimpaction Anesthesia: None Description: Under the operating microscope, the cerumen is carefully removed with a  combination of cerumen currette, alligator forceps, and suction catheters.  After the cerumen is removed, eczematous changes are noted in the ear canals.  The TMs are noted to be normal.  No mass, erythema, or lesions. The patient tolerated the procedure well.    Assessment: 1.  Bilateral recurrent cerumen impaction.  After the disimpaction procedure, asked manage changes are noted within both ear canals.  Both tympanic membranes and middle ear spaces are noted to be normal. 2.  Subjectively stable bilateral high-frequency sensorineural hearing loss.  Plan: 1.  Otomicroscopy with bilateral cerumen disimpaction. 2.  Elocon cream to treat the eczematous otitis externa. 3.  The patient is a candidate for hearing amplification. 4.  The patient will return for reevaluation in 1 year.

## 2024-01-30 DIAGNOSIS — H903 Sensorineural hearing loss, bilateral: Secondary | ICD-10-CM | POA: Insufficient documentation

## 2024-01-30 DIAGNOSIS — H608X3 Other otitis externa, bilateral: Secondary | ICD-10-CM | POA: Insufficient documentation

## 2024-01-30 DIAGNOSIS — H6123 Impacted cerumen, bilateral: Secondary | ICD-10-CM | POA: Insufficient documentation

## 2024-02-08 DIAGNOSIS — M810 Age-related osteoporosis without current pathological fracture: Secondary | ICD-10-CM | POA: Diagnosis not present

## 2024-02-08 DIAGNOSIS — Z0000 Encounter for general adult medical examination without abnormal findings: Secondary | ICD-10-CM | POA: Diagnosis not present

## 2024-02-08 DIAGNOSIS — Z6831 Body mass index (BMI) 31.0-31.9, adult: Secondary | ICD-10-CM | POA: Diagnosis not present

## 2024-02-09 ENCOUNTER — Other Ambulatory Visit: Payer: Self-pay | Admitting: Family Medicine

## 2024-02-09 DIAGNOSIS — M810 Age-related osteoporosis without current pathological fracture: Secondary | ICD-10-CM

## 2024-02-17 DIAGNOSIS — M810 Age-related osteoporosis without current pathological fracture: Secondary | ICD-10-CM | POA: Diagnosis not present

## 2024-02-17 DIAGNOSIS — Z23 Encounter for immunization: Secondary | ICD-10-CM | POA: Diagnosis not present

## 2024-02-17 DIAGNOSIS — Z6831 Body mass index (BMI) 31.0-31.9, adult: Secondary | ICD-10-CM | POA: Diagnosis not present

## 2024-02-17 DIAGNOSIS — E782 Mixed hyperlipidemia: Secondary | ICD-10-CM | POA: Diagnosis not present

## 2024-02-17 DIAGNOSIS — E669 Obesity, unspecified: Secondary | ICD-10-CM | POA: Diagnosis not present

## 2024-02-24 ENCOUNTER — Ambulatory Visit
Admission: RE | Admit: 2024-02-24 | Discharge: 2024-02-24 | Disposition: A | Discharge status: Home / Self Care | Source: Ambulatory Visit | Attending: Family Medicine | Admitting: Family Medicine

## 2024-02-24 DIAGNOSIS — M8588 Other specified disorders of bone density and structure, other site: Secondary | ICD-10-CM | POA: Diagnosis not present

## 2024-02-24 DIAGNOSIS — N958 Other specified menopausal and perimenopausal disorders: Secondary | ICD-10-CM | POA: Diagnosis not present

## 2024-02-24 DIAGNOSIS — Z1231 Encounter for screening mammogram for malignant neoplasm of breast: Secondary | ICD-10-CM

## 2024-02-24 DIAGNOSIS — M810 Age-related osteoporosis without current pathological fracture: Secondary | ICD-10-CM

## 2024-04-04 DIAGNOSIS — H25041 Posterior subcapsular polar age-related cataract, right eye: Secondary | ICD-10-CM | POA: Diagnosis not present

## 2024-04-04 DIAGNOSIS — H25011 Cortical age-related cataract, right eye: Secondary | ICD-10-CM | POA: Diagnosis not present

## 2024-04-04 DIAGNOSIS — H2511 Age-related nuclear cataract, right eye: Secondary | ICD-10-CM | POA: Diagnosis not present

## 2024-07-29 ENCOUNTER — Ambulatory Visit (INDEPENDENT_AMBULATORY_CARE_PROVIDER_SITE_OTHER): Admitting: Otolaryngology

## 2024-08-02 ENCOUNTER — Ambulatory Visit (INDEPENDENT_AMBULATORY_CARE_PROVIDER_SITE_OTHER): Admitting: Otolaryngology

## 2024-08-02 VITALS — BP 130/72 | HR 71 | Temp 98.1°F | Ht 62.0 in | Wt 168.0 lb

## 2024-08-02 DIAGNOSIS — H6123 Impacted cerumen, bilateral: Secondary | ICD-10-CM

## 2024-08-02 DIAGNOSIS — H608X3 Other otitis externa, bilateral: Secondary | ICD-10-CM | POA: Diagnosis not present

## 2024-08-02 DIAGNOSIS — H903 Sensorineural hearing loss, bilateral: Secondary | ICD-10-CM | POA: Diagnosis not present

## 2024-08-02 NOTE — Progress Notes (Signed)
 Patient ID: Anne Parks, female   DOB: 12-Nov-1955, 68 y.o.   MRN: [id number]  Follow-up: Bilateral sensorineural hearing loss, bilateral chronic eczematous otitis externa  Discussed the use of AI scribe software for clinical note transcription with the patient, who gave verbal consent to proceed.  History of Present Illness Anne Parks is a 68 year old female with bilateral high-frequency sensorineural hearing loss who presents for follow-up evaluation.  She experiences occasional itching in her ears, which is managed with Elocon  cream, providing relief. She has a history of bilateral eczematous otitis externa, previously treated with Elocon  cream six months ago.  There have been no significant changes in her hearing over the past six months. However, her husband notes that she does not speak clearly due to her hearing difficulties. She has not used hearing aids previously. She experiences difficulty hearing in certain social situations, such as at a women's fellowship meeting, especially when people are not facing her or when there is background noise.  She has a history of recurrent cerumen impaction, which has been problematic in the past.  Exam: General: Communicates without difficulty, well nourished, no acute distress. Head: Normocephalic, no evidence injury, no tenderness, facial buttresses intact without stepoff. Face/sinus: No tenderness to palpation and percussion. Facial movement is normal and symmetric. Eyes: PERRL, EOMI. No scleral icterus, conjunctivae clear. Neuro: CN II exam reveals vision grossly intact.  No nystagmus at any point of gaze. Ears: Auricles well formed without lesions.  Bilateral cerumen impaction.  Nose: External evaluation reveals normal support and skin without lesions.  Dorsum is intact.  Anterior rhinoscopy reveals congested mucosa over anterior aspect of inferior turbinates and intact septum.  No purulence noted. Oral:  Oral cavity and oropharynx are  intact, symmetric, without erythema or edema.  Mucosa is moist without lesions. Neck: Full range of motion without pain.  There is no significant lymphadenopathy.  No masses palpable.  Thyroid bed within normal limits to palpation.  Parotid glands and submandibular glands equal bilaterally without mass.  Trachea is midline. Neuro:  CN 2-12 grossly intact.   Procedure: Bilateral cerumen disimpaction Anesthesia: None Description: Under the operating microscope, the cerumen is carefully removed with a combination of cerumen currette, alligator forceps, and suction catheters.  After the cerumen is removed, the TMs are noted to be normal.  No mass, erythema, or lesions. The patient tolerated the procedure well.    Assessment and Plan Assessment & Plan Bilateral recurrent cerumen impaction Significant cerumen impaction bilaterally, left ear more impacted, contributing to hearing difficulties. - Remove cerumen from both ears - Advise follow-up in six months for re-evaluation  Bilateral eczematous otitis externa Bilateral eczematous otitis externa with residual itchiness, previously treated with Elocon  cream. No active infection. - Recommend use of Elocon  cream as needed for itchiness  Bilateral high-frequency sensorineural hearing loss Bilateral high-frequency sensorineural hearing loss, likely secondary to presbycusis. No significant change in hearing reported, though her husband notes some difficulty. Occasional difficulty hearing in noisy environments. - Discuss potential future need for hearing aids if hearing worsens - Advise to notify if hearing significantly worsens for further evaluation

## 2025-02-01 ENCOUNTER — Ambulatory Visit (INDEPENDENT_AMBULATORY_CARE_PROVIDER_SITE_OTHER): Admitting: Otolaryngology
